# Patient Record
Sex: Female | Born: 1980 | Race: White | Hispanic: No | Marital: Married | Smoking: Never smoker
Health system: Southern US, Community
[De-identification: ages and names within clinical notes are randomized; demographics above are authoritative.]

## PROBLEM LIST (undated history)

## (undated) DIAGNOSIS — T7840XA Allergy, unspecified, initial encounter: Secondary | ICD-10-CM

## (undated) DIAGNOSIS — Z8619 Personal history of other infectious and parasitic diseases: Secondary | ICD-10-CM

## (undated) DIAGNOSIS — M1990 Unspecified osteoarthritis, unspecified site: Secondary | ICD-10-CM

## (undated) DIAGNOSIS — E785 Hyperlipidemia, unspecified: Secondary | ICD-10-CM

## (undated) DIAGNOSIS — F419 Anxiety disorder, unspecified: Secondary | ICD-10-CM

## (undated) DIAGNOSIS — R768 Other specified abnormal immunological findings in serum: Secondary | ICD-10-CM

## (undated) HISTORY — DX: Hyperlipidemia, unspecified: E78.5

## (undated) HISTORY — DX: Allergy, unspecified, initial encounter: T78.40XA

## (undated) HISTORY — DX: Anxiety disorder, unspecified: F41.9

## (undated) HISTORY — DX: Personal history of other infectious and parasitic diseases: Z86.19

## (undated) HISTORY — DX: Other specified abnormal immunological findings in serum: R76.8

## (undated) HISTORY — PX: HIP SURGERY: SHX245

## (undated) HISTORY — DX: Unspecified osteoarthritis, unspecified site: M19.90

---

## 2014-10-18 ENCOUNTER — Ambulatory Visit: Payer: Self-pay | Admitting: Sports Medicine

## 2014-11-08 ENCOUNTER — Ambulatory Visit: Payer: Self-pay | Admitting: Sports Medicine

## 2014-11-10 ENCOUNTER — Ambulatory Visit: Payer: Self-pay | Admitting: Sports Medicine

## 2014-11-14 ENCOUNTER — Ambulatory Visit: Payer: Self-pay | Admitting: Sports Medicine

## 2014-12-06 ENCOUNTER — Encounter: Payer: Self-pay | Admitting: Sports Medicine

## 2014-12-06 ENCOUNTER — Ambulatory Visit (INDEPENDENT_AMBULATORY_CARE_PROVIDER_SITE_OTHER): Payer: Managed Care, Other (non HMO) | Admitting: Sports Medicine

## 2014-12-06 VITALS — BP 110/74 | Ht 66.0 in | Wt 125.0 lb

## 2014-12-06 DIAGNOSIS — G5751 Tarsal tunnel syndrome, right lower limb: Secondary | ICD-10-CM

## 2014-12-06 DIAGNOSIS — M79674 Pain in right toe(s): Secondary | ICD-10-CM | POA: Diagnosis not present

## 2014-12-06 NOTE — Progress Notes (Signed)
Patient ID: Ashley Adkins, female   DOB: 1980/08/10, 34 y.o.   MRN: [id number] Ms. Ashley Adkins is a 34 year old female with a known L4-L5 herniated disc presents to clinic complaining of right foot pain and inability to flex her great toe. Patient states this issue began in July. She is a Ambulance person and notes she feels more pain on the medial aspect of her right ankle and into her great toe with running and swimming along with her great toe no longer flexing at all times. She does wear neutral orthotics which she notes helps some with the pain when she runs. She also notes when she is running for long periods of time, she gets some numbness to the medial aspect of her right foot along with an involuntary intoeing of this foot. She is still able to do all her normal activities but with some noted pain. She runs and a pair of off-the-shelf orthotics. She has tried custom orthotics in the past without good results.  PMHx: L4-L5 herniated disc, LLE venous insufficiency  Review of Systems: (+) right foot pain, inability to flex right great toe, numbness (-) tingling, urinary or fecal incontinence, weakness  Physical Examination BP 110/74 mmHg  Ht 5\' 6"  (1.676 m)  Wt 125 lb (56.7 kg)  BMI 20.19 kg/m2 Constitutional: Healthy, well-nourished, well-appearing Gait: mild intoeing of right foot with running Right hip: Full ROM with 3/5 hip flexion and 3/5 hip abduction, 5/5 hip adduction Left Hip: Full ROM with 5/5 hip flexion, 5/5 hip abduction/adduction Right Ankle: Full ROM with 5/5 strength with DF, PF, inversion and flexion, Anterior drawer negative. Positive tinel at the tarsal tunnel, TTP over medial aspect of right ankle, 5/5 strength of right great toe extension but 0/5 strength of right great toe flexion. Able to flex at the MTP joint but not at the IP joint. DTR bilaterally 1+ Sensation with light touch intact. No atrophy  Ultrasound showed an intact flexor hallucis longus tendon.  Assessment  and Plan: 34 year old female with: 1. Right foot pain, weakness, and loss of great toe flexion, concern for tarsal tunnel syndrome vs lumbar radiculopathy  Patient does have a history of lumbar radiculopathy. The weakness with great toe flexion is concerning. I want to proceed with an EMG/nerve conduction study to better evaluate. Phone follow-up after that study to discuss the results and delineate further treatment.

## 2014-12-15 ENCOUNTER — Ambulatory Visit (INDEPENDENT_AMBULATORY_CARE_PROVIDER_SITE_OTHER): Payer: Managed Care, Other (non HMO) | Admitting: Diagnostic Neuroimaging

## 2014-12-15 ENCOUNTER — Encounter (INDEPENDENT_AMBULATORY_CARE_PROVIDER_SITE_OTHER): Payer: Self-pay | Admitting: Diagnostic Neuroimaging

## 2014-12-15 DIAGNOSIS — M2141 Flat foot [pes planus] (acquired), right foot: Secondary | ICD-10-CM

## 2014-12-15 DIAGNOSIS — R29898 Other symptoms and signs involving the musculoskeletal system: Secondary | ICD-10-CM

## 2014-12-15 DIAGNOSIS — Z0289 Encounter for other administrative examinations: Secondary | ICD-10-CM

## 2014-12-16 NOTE — Procedures (Signed)
   [facility]  NCS (NERVE CONDUCTION STUDY) WITH EMG (ELECTROMYOGRAPHY) REPORT   STUDY DATE: 12/15/14 PATIENT NAME: Ashley Adkins DOB: 28-Apr-1980 MRN: [id number]  ORDERING CLINICIAN: Judi Saa, DO  TECHNOLOGIST: Gearldine Shown  ELECTROMYOGRAPHER: Glenford Bayley. Horst Ostermiller, MD  CLINICAL INFORMATION: 34 year old female with right toe flexion weakness and right foot numbness. Evaluate for lumbar radiculopathy versus tarsal tunnel syndrome.  FINDINGS: NERVE CONDUCTION STUDY: Right peroneal and tibial motor responses and F wave latencies are normal. Right sural and peroneal sensory responses are normal. Bilateral medial and lateral plantar responses are normal.   NEEDLE ELECTROMYOGRAPHY: Needle examination of right lower extremity vastus medialis, tibialis anterior, tibialis posterior, gastrocnemius and right L4-5 and L5-S1 paraspinal muscles are normal.  IMPRESSION:  Normal study. No electrodiagnostic evidence of large fiber neuropathy, lumbar radiculopathy or tarsal tunnel syndrome at this time.    INTERPRETING PHYSICIAN:  Suanne Marker, MD Certified in Neurology, Neurophysiology and Neuroimaging  [facility] [address] [place], [place] [address] [phone]

## 2014-12-22 ENCOUNTER — Telehealth: Payer: Self-pay | Admitting: Diagnostic Neuroimaging

## 2014-12-23 ENCOUNTER — Telehealth: Payer: Self-pay | Admitting: Sports Medicine

## 2014-12-23 DIAGNOSIS — G5751 Tarsal tunnel syndrome, right lower limb: Secondary | ICD-10-CM

## 2014-12-23 DIAGNOSIS — M5126 Other intervertebral disc displacement, lumbar region: Secondary | ICD-10-CM

## 2014-12-23 DIAGNOSIS — M79674 Pain in right toe(s): Secondary | ICD-10-CM

## 2014-12-23 NOTE — Addendum Note (Signed)
Addended by: Annita Brod on: 12/23/2014 10:24 AM   Modules accepted: Orders

## 2014-12-23 NOTE — Telephone Encounter (Signed)
I spoke with the patient on the phone today after reviewing the results of her EMG/nerve conduction study. It is a normal study. I am still at a loss to explain her inability to flex her great toe. I still think the reason is neurological despite the normal EMG. Therefore, I will have her see Dr.Toni Lucia Gaskins for further evaluation. I've asked the patient to follow-up with me after that consultation. If neurology is unable to provide an answer that I will likely have her see Dr.John Hewitt.

## 2014-12-29 ENCOUNTER — Ambulatory Visit (INDEPENDENT_AMBULATORY_CARE_PROVIDER_SITE_OTHER): Payer: Managed Care, Other (non HMO) | Admitting: Neurology

## 2014-12-29 ENCOUNTER — Encounter: Payer: Self-pay | Admitting: Neurology

## 2014-12-29 VITALS — BP 130/69 | HR 58 | Ht 66.0 in | Wt 126.8 lb

## 2014-12-29 DIAGNOSIS — R269 Unspecified abnormalities of gait and mobility: Secondary | ICD-10-CM

## 2014-12-29 DIAGNOSIS — G629 Polyneuropathy, unspecified: Secondary | ICD-10-CM | POA: Diagnosis not present

## 2014-12-29 DIAGNOSIS — M2141 Flat foot [pes planus] (acquired), right foot: Secondary | ICD-10-CM

## 2014-12-29 DIAGNOSIS — G35 Multiple sclerosis: Secondary | ICD-10-CM

## 2014-12-29 DIAGNOSIS — M5416 Radiculopathy, lumbar region: Secondary | ICD-10-CM

## 2014-12-29 DIAGNOSIS — M79671 Pain in right foot: Secondary | ICD-10-CM

## 2014-12-29 DIAGNOSIS — G5791 Unspecified mononeuropathy of right lower limb: Secondary | ICD-10-CM | POA: Diagnosis not present

## 2014-12-29 DIAGNOSIS — R29898 Other symptoms and signs involving the musculoskeletal system: Secondary | ICD-10-CM | POA: Insufficient documentation

## 2014-12-29 MED ORDER — CYCLOBENZAPRINE HCL ER 15 MG PO CP24: 15.0000 mg | ORAL_CAPSULE | Freq: Every day | ORAL | Status: DC | PRN

## 2014-12-29 NOTE — Progress Notes (Signed)
[facility]    Provider:  Dr Lucia Gaskins Referring Provider: Ralene Cork, DO Primary Care Physician:  No PCP Per Patient  CC:  Right toe weakness  HPI:  Candee Hoon is a 34 y.o. female here as a referral from Dr. Margaretha Sheffield for right toe weakness. She has problems moving her toe. She can't get her flip flops on and she has pain in the achilles. Her right foot has been falling asleep when running. She wears narrow shoes. Mostly the right toe goes numb. She has back pain and L4/L5. She was running the other day and her toe was burning. She has LBP that comes and goes. She has neck pain and musculoskeletal pain. Worse with lifting her little boy and when he lays on him. She saw a podiatrist and they sent her to a neurologist and she has a bone sour on the top of her big toe on the right. Her right foot turns in. No history of neurologic symptoms. She is fatigued.   Reviewed notes, labs and imaging from outside physicians, which showed:  EMG/NCS results: personally reviewed data and agree with following: NEEDLE ELECTROMYOGRAPHY: Needle examination of right lower extremity vastus medialis, tibialis anterior, tibialis posterior, gastrocnemius and right L4-5 and L5-S1 paraspinal muscles are normal.  Review of Systems: Patient complains of symptoms per HPI as well as the following symptoms: fatigue, feeling cold. Pertinent negatives per HPI. All others negative.   Social History   Social History  . Marital Status: Married    Spouse Name: Mat Carne  . Number of Children: 1  . Years of Education: 16   Occupational History  . stay at home mom    Social History Main Topics  . Smoking status: Never Smoker   . Smokeless tobacco: Not on file  . Alcohol Use: No  . Drug Use: No  . Sexual Activity: Not on file   Other Topics Concern  . Not on file   Social History Narrative   Lives at home with husband and child.   Caffeine use: 1-2 cups per day   Tea- 1 cup/day         History reviewed. No pertinent family history.  History reviewed. No pertinent past medical history.  Past Surgical History  Procedure Laterality Date  . Hip surgery Left     Current Outpatient Prescriptions  Medication Sig Dispense Refill  . Fexofenadine HCl (ALLEGRA PO) Take 1 tablet by mouth daily.     No current facility-administered medications for this visit.    Allergies as of 12/29/2014 - Review Complete 12/29/2014  Allergen Reaction Noted  . Latex Rash 12/06/2014    Vitals: BP 130/69 mmHg  Pulse 58  Ht  (1.676 m)  Wt 126 lb 12.8 oz (57.516 kg)  BMI 20.48 kg/m2 Last Weight:  Wt Readings from Last 1 Encounters:  12/29/14 126 lb 12.8 oz (57.516 kg)   Last Height:   Ht Readings from Last 1 Encounters:  12/29/14  (1.676 m)   Physical exam: Exam: Gen: NAD, conversant, well nourised, well groomed                     CV: RRR, no MRG. No Carotid Bruits. No peripheral edema, warm, nontender Eyes: Conjunctivae clear without exudates or hemorrhage  Neuro: Detailed Neurologic Exam  Speech:    Speech is normal; fluent and spontaneous with normal comprehension.  Cognition:    The patient is oriented to person, place, and time;  recent and remote memory intact;     language fluent;     normal attention, concentration,     fund of knowledge Cranial Nerves:    The pupils are equal, round, and reactive to light. The fundi are normal and spontaneous venous pulsations are present. Visual fields are full to finger confrontation. Extraocular movements are intact. Trigeminal sensation is intact and the muscles of mastication are normal. The face is symmetric. The palate elevates in the midline. Hearing intact. Voice is normal. Shoulder shrug is normal. The tongue has normal motion without fasciculations.   Coordination:    Normal finger to nose and heel to shin. Normal rapid alternating movements.   Gait:    Heel-toe and tandem gait are normal.   Motor  Observation:    No asymmetry, no atrophy, and no involuntary movements noted. Tone:    Normal muscle tone.    Posture:    Posture is normal. normal erect    Strength: weakness of right great toe extension and toe  flexion. Otherwise strength is V/V in the upper and lower limbs.      Sensation: dec sensation in the right foot     Reflex Exam:  DTR's:    Deep tendon reflexes in the upper and lower extremities are normal bilaterally.   Toes:    The toes are downgoing bilaterally.   Clonus:    Clonus is absent.       Assessment/Plan:  [age] year old with weakness and paresthesias of the right great toe as well as weakness of toe flexion. EMG/NCS was unrevealing and did not reveal a peripheral cause. Can order MRI of the lumbar spine, cervical and brain.   Naomie Dean, MD  [facility] [address] [place], [place] [address]  Phone [phone] Fax [phone]  A total of 40 minutes was spent face-to-face with this patient. Over half this time was spent on counseling patient on the weakness diagnosis and different diagnostic and therapeutic options available.

## 2014-12-29 NOTE — Patient Instructions (Signed)
Overall you are doing fairly well but I do want to suggest a few things today:   Remember to drink plenty of fluid, eat healthy meals and do not skip any meals. Try to eat protein with a every meal and eat a healthy snack such as fruit or nuts in between meals. Try to keep a regular sleep-wake schedule and try to exercise daily, particularly in the form of walking, 20-30 minutes a day, if you can.   As far as your medications are concerned, I would like to suggest: Amrix  at night  As far as diagnostic testing: MRi of the brain, cervical spine and lumbar spine, labs  I would like to see you back after imaging, sooner if we need to. Please call us with any interim questions, concerns, problems, updates or refill requests.   Please also call us for any test results so we can go over those with you on the phone.  My clinical assistant and will answer any of your questions and relay your messages to me and also relay most of my messages to you.   Our phone number is [phone]. We also have an after hours call service for urgent matters and there is a physician on-call for urgent questions. For any emergencies you know to call 911 or go to the nearest emergency room

## 2015-01-02 LAB — THYROID PANEL WITH TSH
Free Thyroxine Index: 2.2 (ref 1.2–4.9)
T3 Uptake Ratio: 31 % (ref 24–39)
T4 TOTAL: 7.1 ug/dL (ref 4.5–12.0)
TSH: 1.03 u[IU]/mL (ref 0.450–4.500)

## 2015-01-02 LAB — ENA+DNA/DS+SJORGEN'S
ENA RNP Ab: 0.8 AI (ref 0.0–0.9)
ENA SM Ab Ser-aCnc: <0.2 AI (ref 0.0–0.9)
ENA SSA (RO) Ab: <0.2 AI (ref 0.0–0.9)
ENA SSB (LA) Ab: <0.2 AI (ref 0.0–0.9)
dsDNA Ab: 12 IU/mL — ABNORMAL HIGH (ref 0–9)

## 2015-01-02 LAB — COMPREHENSIVE METABOLIC PANEL
A/G RATIO: 2 (ref 1.1–2.5)
ALK PHOS: 50 IU/L (ref 39–117)
ALT: 9 IU/L (ref 0–32)
AST: 21 IU/L (ref 0–40)
Albumin: 4.4 g/dL (ref 3.5–5.5)
BILIRUBIN TOTAL: 0.3 mg/dL (ref 0.0–1.2)
BUN/Creatinine Ratio: 16 (ref 8–20)
BUN: 15 mg/dL (ref 6–20)
CHLORIDE: 101 mmol/L (ref 96–106)
CO2: 23 mmol/L (ref 18–29)
Calcium: 9.3 mg/dL (ref 8.7–10.2)
Creatinine, Ser: 0.92 mg/dL (ref 0.57–1.00)
GFR calc Af Amer: 93 mL/min/{1.73_m2} (ref >59)
GFR, EST NON AFRICAN AMERICAN: 80 mL/min/{1.73_m2} (ref >59)
GLOBULIN, TOTAL: 2.2 g/dL (ref 1.5–4.5)
Glucose: 104 mg/dL — ABNORMAL HIGH (ref 65–99)
POTASSIUM: 4.4 mmol/L (ref 3.5–5.2)
SODIUM: 139 mmol/L (ref 134–144)
Total Protein: 6.6 g/dL (ref 6.0–8.5)

## 2015-01-02 LAB — VITAMIN B6: Vitamin B6: >100 ug/L — ABNORMAL HIGH (ref 2.0–32.8)

## 2015-01-02 LAB — RHEUMATOID FACTOR: Rhuematoid fact SerPl-aCnc: <10 IU/mL (ref 0.0–13.9)

## 2015-01-02 LAB — VITAMIN B1: Thiamine: 109.3 nmol/L (ref 66.5–200.0)

## 2015-01-02 LAB — HEAVY METALS, BLOOD
Arsenic: 6 ug/L (ref 2–23)
Lead, Blood: NOT DETECTED ug/dL (ref 0–19)
MERCURY: 2.5 ug/L (ref 0.0–14.9)

## 2015-01-02 LAB — B12 AND FOLATE PANEL: VITAMIN B 12: 970 pg/mL — AB (ref 211–946)

## 2015-01-02 LAB — B. BURGDORFI ANTIBODIES: Lyme IgG/IgM Ab: <0.91 {ISR} (ref 0.00–0.90)

## 2015-01-02 LAB — CBC
Hematocrit: 36.9 % (ref 34.0–46.6)
Hemoglobin: 12.2 g/dL (ref 11.1–15.9)
MCH: 27.8 pg (ref 26.6–33.0)
MCHC: 33.1 g/dL (ref 31.5–35.7)
MCV: 84 fL (ref 79–97)
NRBC: 0 % (ref 0–0)
Platelets: 202 10*3/uL (ref 150–379)
RBC: 4.39 x10E6/uL (ref 3.77–5.28)
RDW: 13.5 % (ref 12.3–15.4)
WBC: 4 10*3/uL (ref 3.4–10.8)

## 2015-01-02 LAB — METHYLMALONIC ACID, SERUM: METHYLMALONIC ACID: 138 nmol/L (ref 0–378)

## 2015-01-02 LAB — HEMOGLOBIN A1C
Est. average glucose Bld gHb Est-mCnc: 114 mg/dL
Hgb A1c MFr Bld: 5.6 % (ref 4.8–5.6)

## 2015-01-02 LAB — ANA W/REFLEX: Anti Nuclear Antibody(ANA): POSITIVE — AB

## 2015-01-04 ENCOUNTER — Other Ambulatory Visit: Payer: Self-pay | Admitting: Neurology

## 2015-01-04 DIAGNOSIS — R768 Other specified abnormal immunological findings in serum: Secondary | ICD-10-CM

## 2015-01-05 ENCOUNTER — Telehealth: Payer: Self-pay | Admitting: *Deleted

## 2015-01-05 NOTE — Telephone Encounter (Signed)
Called pt and relayed results per Dr Lucia Gaskins note. Pt verbalized understanding. Gave her hours for when she can come for lab work. She denied taking extra Vitamin B6. She only takes multivitamin.

## 2015-01-05 NOTE — Telephone Encounter (Signed)
-----   Message from Anson Fret, MD sent at 01/04/2015  2:59 PM EST ----- Let patient know her labs looked good. One lab was positive though, double stranded DNA which we see in rheumatologic disorders such as Lupus but I suspect this may be a false positive (we get false positive results here often) as hers was just barely above the max normal value (max normal 9 and hers was 12). I ordered a follow up test and she can come to the office to have it done at her convenience. Her vitamin B6 level was a little high which is fine but wanted to know if she is she taking B6 supplementation daily? I didn't see that on her med list. If she is, how much is she taking? Recommended intake is no more than a few mg a day. i ask just to make sure she is not taking too much daily, too high doses can cause toxicity (like taking 100mg  a day can cause toxicity) thanks.

## 2015-01-10 NOTE — Telephone Encounter (Addendum)
Pt called and would like a call back pertaining to her lab work. She would also like to know if an MRI is necessary right now. It will cost $3,000. She also asked about nerve test on her toe. Please call and advise [phone]

## 2015-01-11 ENCOUNTER — Other Ambulatory Visit: Payer: Self-pay | Admitting: Neurology

## 2015-01-11 DIAGNOSIS — E785 Hyperlipidemia, unspecified: Secondary | ICD-10-CM

## 2015-01-11 NOTE — Telephone Encounter (Signed)
She will come in for emg. She will hold off on the MRI for now and we will follow clinically.

## 2015-01-11 NOTE — Telephone Encounter (Signed)
LVM for pt to call back and schedule EMG. Gave [facility] phone number. Per Dr Lucia Gaskins- only doing EMG, NOT NCS. Can place in next available slot.

## 2015-01-12 NOTE — Telephone Encounter (Signed)
Scheduled appt for 02/09/15 at 930am per Dr Lucia Gaskins request for EMG.

## 2015-01-13 ENCOUNTER — Other Ambulatory Visit (INDEPENDENT_AMBULATORY_CARE_PROVIDER_SITE_OTHER): Payer: Self-pay

## 2015-01-13 DIAGNOSIS — E785 Hyperlipidemia, unspecified: Secondary | ICD-10-CM

## 2015-01-13 DIAGNOSIS — Z0289 Encounter for other administrative examinations: Secondary | ICD-10-CM

## 2015-01-13 DIAGNOSIS — R768 Other specified abnormal immunological findings in serum: Secondary | ICD-10-CM

## 2015-01-16 LAB — LIPID PANEL
CHOL/HDL RATIO: 1.8 ratio (ref 0.0–4.4)
CHOLESTEROL TOTAL: 221 mg/dL — AB (ref 100–199)
HDL: 120 mg/dL (ref >39)
LDL CALC: 92 mg/dL (ref 0–99)
TRIGLYCERIDES: 46 mg/dL (ref 0–149)
VLDL Cholesterol Cal: 9 mg/dL (ref 5–40)

## 2015-01-16 LAB — ANTINUCLEAR ANTIBODIES, IFA: ANA Titer 1: NEGATIVE

## 2015-01-18 ENCOUNTER — Telehealth: Payer: Self-pay | Admitting: *Deleted

## 2015-01-18 NOTE — Telephone Encounter (Signed)
-----   Message from Anson Fret, MD sent at 01/17/2015  7:01 PM EST ----- ANA was negative. The other positive ANA was a false positive. Her cholesterol looks beautiful, her LDL is 92 and her HDL is 120. Her overall 221 is not a problem. thanks

## 2015-01-18 NOTE — Telephone Encounter (Signed)
Called and spoke to pt about results per Dr Ahern note. Pt verbalized understanding.  

## 2015-02-09 ENCOUNTER — Ambulatory Visit (INDEPENDENT_AMBULATORY_CARE_PROVIDER_SITE_OTHER): Payer: Managed Care, Other (non HMO) | Admitting: Neurology

## 2015-02-09 DIAGNOSIS — M2141 Flat foot [pes planus] (acquired), right foot: Secondary | ICD-10-CM | POA: Diagnosis not present

## 2015-02-09 DIAGNOSIS — R29898 Other symptoms and signs involving the musculoskeletal system: Secondary | ICD-10-CM

## 2015-02-11 NOTE — Progress Notes (Signed)
  [facility]    Provider:  Dr Lucia Gaskins  CC: Right toe weakness  HPI: Ashley Adkins is a 35 y.o. female here as a referral from Dr. Margaretha Sheffield for right toe flexion weakness in all the toes. It is improving. She has problems moving her toes. She can't get her flip flops on and she has pain in the achilles. Her right foot has been falling asleep when running. She wears narrow shoes. Mostly the right toe goes numb. She has back pain and L4/L5. She was running the other day and her toe was burning. She has LBP that comes and goes. She has neck pain and musculoskeletal pain. Worse with lifting her little boy and when he lays on him. She saw a podiatrist and they sent her to a neurologist and she has a bone sour on the top of her big toe on the right. Her right foot turns in. No history of neurologic symptoms. She is fatigued.    EMG/NCS 12/16/2014 FINDINGS: NERVE CONDUCTION STUDY: Right peroneal and tibial motor responses and F wave latencies are normal. Right sural and peroneal sensory responses are normal. Bilateral medial and lateral plantar responses are normal. NEEDLE ELECTROMYOGRAPHY: Needle examination of right lower extremity vastus medialis, tibialis anterior, tibialis posterior, gastrocnemius and right L4-5 and L5-S1 paraspinal muscles are normal. IMPRESSION:  Normal study. No electrodiagnostic evidence of large fiber neuropathy, lumbar radiculopathy or tarsal tunnel syndrome at this time.   Patient is back to day for additional EMG testing. The Flexor Digitorum Longus, Flexor Hallucis Longus, Flexor Digitorum Brevis, Biceps Femoris(L) and Gluteus Maximum/Medius all WNL.   Assessment/Plan:    Naomie Dean, MD  [facility] [address] [place], [place] [address]  Phone [phone] Fax [phone]

## 2015-03-08 NOTE — Telephone Encounter (Signed)
error 

## 2015-03-23 ENCOUNTER — Ambulatory Visit: Payer: Managed Care, Other (non HMO) | Admitting: Allergy and Immunology

## 2015-08-07 ENCOUNTER — Other Ambulatory Visit: Payer: Self-pay | Admitting: Neurology

## 2015-08-07 ENCOUNTER — Telehealth: Payer: Self-pay | Admitting: Neurology

## 2015-08-07 MED ORDER — CYCLOBENZAPRINE HCL 10 MG PO TABS: 10.0000 mg | ORAL_TABLET | Freq: Three times a day (TID) | ORAL | 3 refills | Status: DC | PRN

## 2015-08-07 NOTE — Telephone Encounter (Signed)
Pt called in requesting rx cyclobenzaprine (AMRIX) 15 MG 24 hr capsule be re-done for an 8hr dose. The current rx makes her to drowsy through out the day. Please call and advise [phone]

## 2015-08-07 NOTE — Telephone Encounter (Signed)
I called in the tid prn version thanks!

## 2015-08-08 NOTE — Telephone Encounter (Signed)
Called and LVM relaying Dr Lucia Gaskins called in new rx request for q8hr. Gave [facility] phone number if she has further questions.

## 2016-01-08 DIAGNOSIS — K635 Polyp of colon: Secondary | ICD-10-CM

## 2016-01-08 HISTORY — DX: Polyp of colon: K63.5

## 2016-03-13 DIAGNOSIS — D235 Other benign neoplasm of skin of trunk: Secondary | ICD-10-CM | POA: Diagnosis not present

## 2016-03-13 DIAGNOSIS — D1801 Hemangioma of skin and subcutaneous tissue: Secondary | ICD-10-CM | POA: Diagnosis not present

## 2016-03-13 DIAGNOSIS — L821 Other seborrheic keratosis: Secondary | ICD-10-CM | POA: Diagnosis not present

## 2016-03-13 DIAGNOSIS — L309 Dermatitis, unspecified: Secondary | ICD-10-CM | POA: Diagnosis not present

## 2016-03-13 DIAGNOSIS — B079 Viral wart, unspecified: Secondary | ICD-10-CM | POA: Diagnosis not present

## 2016-04-29 DIAGNOSIS — M9905 Segmental and somatic dysfunction of pelvic region: Secondary | ICD-10-CM | POA: Diagnosis not present

## 2016-04-29 DIAGNOSIS — M62830 Muscle spasm of back: Secondary | ICD-10-CM | POA: Diagnosis not present

## 2016-04-29 DIAGNOSIS — M791 Myalgia: Secondary | ICD-10-CM | POA: Diagnosis not present

## 2016-04-29 DIAGNOSIS — M9903 Segmental and somatic dysfunction of lumbar region: Secondary | ICD-10-CM | POA: Diagnosis not present

## 2016-04-29 DIAGNOSIS — M9902 Segmental and somatic dysfunction of thoracic region: Secondary | ICD-10-CM | POA: Diagnosis not present

## 2016-05-03 DIAGNOSIS — M791 Myalgia: Secondary | ICD-10-CM | POA: Diagnosis not present

## 2016-05-03 DIAGNOSIS — Z1151 Encounter for screening for human papillomavirus (HPV): Secondary | ICD-10-CM | POA: Diagnosis not present

## 2016-05-03 DIAGNOSIS — M62830 Muscle spasm of back: Secondary | ICD-10-CM | POA: Diagnosis not present

## 2016-05-03 DIAGNOSIS — M9903 Segmental and somatic dysfunction of lumbar region: Secondary | ICD-10-CM | POA: Diagnosis not present

## 2016-05-03 DIAGNOSIS — Z1389 Encounter for screening for other disorder: Secondary | ICD-10-CM | POA: Diagnosis not present

## 2016-05-03 DIAGNOSIS — M9905 Segmental and somatic dysfunction of pelvic region: Secondary | ICD-10-CM | POA: Diagnosis not present

## 2016-05-03 DIAGNOSIS — Z6820 Body mass index (BMI) 20.0-20.9, adult: Secondary | ICD-10-CM | POA: Diagnosis not present

## 2016-05-03 DIAGNOSIS — F419 Anxiety disorder, unspecified: Secondary | ICD-10-CM | POA: Diagnosis not present

## 2016-05-03 DIAGNOSIS — Z130 Encounter for screening for diseases of the blood and blood-forming organs and certain disorders involving the immune mechanism: Secondary | ICD-10-CM | POA: Diagnosis not present

## 2016-05-03 DIAGNOSIS — R194 Change in bowel habit: Secondary | ICD-10-CM | POA: Diagnosis not present

## 2016-05-03 DIAGNOSIS — Z01419 Encounter for gynecological examination (general) (routine) without abnormal findings: Secondary | ICD-10-CM | POA: Diagnosis not present

## 2016-05-03 DIAGNOSIS — Z124 Encounter for screening for malignant neoplasm of cervix: Secondary | ICD-10-CM | POA: Diagnosis not present

## 2016-05-03 DIAGNOSIS — M9902 Segmental and somatic dysfunction of thoracic region: Secondary | ICD-10-CM | POA: Diagnosis not present

## 2016-05-24 DIAGNOSIS — M9905 Segmental and somatic dysfunction of pelvic region: Secondary | ICD-10-CM | POA: Diagnosis not present

## 2016-05-24 DIAGNOSIS — M9902 Segmental and somatic dysfunction of thoracic region: Secondary | ICD-10-CM | POA: Diagnosis not present

## 2016-05-24 DIAGNOSIS — M791 Myalgia: Secondary | ICD-10-CM | POA: Diagnosis not present

## 2016-05-24 DIAGNOSIS — M62830 Muscle spasm of back: Secondary | ICD-10-CM | POA: Diagnosis not present

## 2016-05-24 DIAGNOSIS — M9903 Segmental and somatic dysfunction of lumbar region: Secondary | ICD-10-CM | POA: Diagnosis not present

## 2016-05-28 DIAGNOSIS — M62830 Muscle spasm of back: Secondary | ICD-10-CM | POA: Diagnosis not present

## 2016-05-28 DIAGNOSIS — M791 Myalgia: Secondary | ICD-10-CM | POA: Diagnosis not present

## 2016-05-28 DIAGNOSIS — M9905 Segmental and somatic dysfunction of pelvic region: Secondary | ICD-10-CM | POA: Diagnosis not present

## 2016-05-28 DIAGNOSIS — M9903 Segmental and somatic dysfunction of lumbar region: Secondary | ICD-10-CM | POA: Diagnosis not present

## 2016-05-28 DIAGNOSIS — M9902 Segmental and somatic dysfunction of thoracic region: Secondary | ICD-10-CM | POA: Diagnosis not present

## 2016-05-29 DIAGNOSIS — R197 Diarrhea, unspecified: Secondary | ICD-10-CM | POA: Diagnosis not present

## 2016-05-29 DIAGNOSIS — R1031 Right lower quadrant pain: Secondary | ICD-10-CM | POA: Diagnosis not present

## 2016-05-31 DIAGNOSIS — M791 Myalgia: Secondary | ICD-10-CM | POA: Diagnosis not present

## 2016-05-31 DIAGNOSIS — M9902 Segmental and somatic dysfunction of thoracic region: Secondary | ICD-10-CM | POA: Diagnosis not present

## 2016-05-31 DIAGNOSIS — M9903 Segmental and somatic dysfunction of lumbar region: Secondary | ICD-10-CM | POA: Diagnosis not present

## 2016-05-31 DIAGNOSIS — M62830 Muscle spasm of back: Secondary | ICD-10-CM | POA: Diagnosis not present

## 2016-05-31 DIAGNOSIS — M9905 Segmental and somatic dysfunction of pelvic region: Secondary | ICD-10-CM | POA: Diagnosis not present

## 2016-06-05 DIAGNOSIS — M791 Myalgia: Secondary | ICD-10-CM | POA: Diagnosis not present

## 2016-06-05 DIAGNOSIS — M62830 Muscle spasm of back: Secondary | ICD-10-CM | POA: Diagnosis not present

## 2016-06-05 DIAGNOSIS — M9905 Segmental and somatic dysfunction of pelvic region: Secondary | ICD-10-CM | POA: Diagnosis not present

## 2016-06-05 DIAGNOSIS — M9903 Segmental and somatic dysfunction of lumbar region: Secondary | ICD-10-CM | POA: Diagnosis not present

## 2016-06-05 DIAGNOSIS — M9902 Segmental and somatic dysfunction of thoracic region: Secondary | ICD-10-CM | POA: Diagnosis not present

## 2016-06-10 DIAGNOSIS — M9902 Segmental and somatic dysfunction of thoracic region: Secondary | ICD-10-CM | POA: Diagnosis not present

## 2016-06-10 DIAGNOSIS — M9905 Segmental and somatic dysfunction of pelvic region: Secondary | ICD-10-CM | POA: Diagnosis not present

## 2016-06-10 DIAGNOSIS — M791 Myalgia: Secondary | ICD-10-CM | POA: Diagnosis not present

## 2016-06-10 DIAGNOSIS — M9903 Segmental and somatic dysfunction of lumbar region: Secondary | ICD-10-CM | POA: Diagnosis not present

## 2016-06-10 DIAGNOSIS — M62830 Muscle spasm of back: Secondary | ICD-10-CM | POA: Diagnosis not present

## 2016-06-11 ENCOUNTER — Other Ambulatory Visit: Payer: Self-pay | Admitting: Family Medicine

## 2016-06-11 DIAGNOSIS — R1030 Lower abdominal pain, unspecified: Secondary | ICD-10-CM | POA: Diagnosis not present

## 2016-06-11 DIAGNOSIS — R590 Localized enlarged lymph nodes: Secondary | ICD-10-CM | POA: Diagnosis not present

## 2016-06-11 DIAGNOSIS — R1032 Left lower quadrant pain: Secondary | ICD-10-CM

## 2016-06-12 ENCOUNTER — Ambulatory Visit
Admission: RE | Admit: 2016-06-12 | Discharge: 2016-06-12 | Disposition: A | Discharge status: Home / Self Care | Payer: BLUE CROSS/BLUE SHIELD | Source: Ambulatory Visit | Attending: Family Medicine | Admitting: Family Medicine

## 2016-06-12 DIAGNOSIS — R1909 Other intra-abdominal and pelvic swelling, mass and lump: Secondary | ICD-10-CM | POA: Diagnosis not present

## 2016-06-12 DIAGNOSIS — R1032 Left lower quadrant pain: Secondary | ICD-10-CM

## 2016-06-13 ENCOUNTER — Ambulatory Visit
Admission: RE | Admit: 2016-06-13 | Discharge: 2016-06-13 | Disposition: A | Discharge status: Home / Self Care | Payer: BLUE CROSS/BLUE SHIELD | Source: Ambulatory Visit | Attending: Family Medicine | Admitting: Family Medicine

## 2016-06-13 ENCOUNTER — Other Ambulatory Visit: Payer: Self-pay | Admitting: Family Medicine

## 2016-06-13 DIAGNOSIS — R1032 Left lower quadrant pain: Secondary | ICD-10-CM

## 2016-06-13 DIAGNOSIS — R1909 Other intra-abdominal and pelvic swelling, mass and lump: Secondary | ICD-10-CM | POA: Diagnosis not present

## 2016-06-13 MED ORDER — IOPAMIDOL (ISOVUE-300) INJECTION 61%
100.0000 mL | Freq: Once | INTRAVENOUS | Status: AC | PRN
  Administered 2016-06-13: 100 mL via INTRAVENOUS

## 2016-06-17 ENCOUNTER — Other Ambulatory Visit: Payer: Managed Care, Other (non HMO)

## 2016-06-19 DIAGNOSIS — D72820 Lymphocytosis (symptomatic): Secondary | ICD-10-CM | POA: Diagnosis not present

## 2016-06-19 DIAGNOSIS — R197 Diarrhea, unspecified: Secondary | ICD-10-CM | POA: Diagnosis not present

## 2016-06-19 DIAGNOSIS — K635 Polyp of colon: Secondary | ICD-10-CM | POA: Diagnosis not present

## 2016-06-20 ENCOUNTER — Encounter: Payer: Self-pay | Admitting: Obstetrics and Gynecology

## 2016-06-25 DIAGNOSIS — K635 Polyp of colon: Secondary | ICD-10-CM | POA: Diagnosis not present

## 2016-06-25 DIAGNOSIS — D72820 Lymphocytosis (symptomatic): Secondary | ICD-10-CM | POA: Diagnosis not present

## 2016-09-19 DIAGNOSIS — M9905 Segmental and somatic dysfunction of pelvic region: Secondary | ICD-10-CM | POA: Diagnosis not present

## 2016-09-19 DIAGNOSIS — M62830 Muscle spasm of back: Secondary | ICD-10-CM | POA: Diagnosis not present

## 2016-09-19 DIAGNOSIS — M9903 Segmental and somatic dysfunction of lumbar region: Secondary | ICD-10-CM | POA: Diagnosis not present

## 2016-09-19 DIAGNOSIS — M9902 Segmental and somatic dysfunction of thoracic region: Secondary | ICD-10-CM | POA: Diagnosis not present

## 2016-09-19 DIAGNOSIS — M791 Myalgia: Secondary | ICD-10-CM | POA: Diagnosis not present

## 2016-09-23 DIAGNOSIS — M791 Myalgia: Secondary | ICD-10-CM | POA: Diagnosis not present

## 2016-09-23 DIAGNOSIS — M9905 Segmental and somatic dysfunction of pelvic region: Secondary | ICD-10-CM | POA: Diagnosis not present

## 2016-09-23 DIAGNOSIS — M9902 Segmental and somatic dysfunction of thoracic region: Secondary | ICD-10-CM | POA: Diagnosis not present

## 2016-09-23 DIAGNOSIS — M62830 Muscle spasm of back: Secondary | ICD-10-CM | POA: Diagnosis not present

## 2016-09-23 DIAGNOSIS — M9903 Segmental and somatic dysfunction of lumbar region: Secondary | ICD-10-CM | POA: Diagnosis not present

## 2016-10-24 DIAGNOSIS — M9903 Segmental and somatic dysfunction of lumbar region: Secondary | ICD-10-CM | POA: Diagnosis not present

## 2016-10-24 DIAGNOSIS — M62830 Muscle spasm of back: Secondary | ICD-10-CM | POA: Diagnosis not present

## 2016-10-24 DIAGNOSIS — M791 Myalgia, unspecified site: Secondary | ICD-10-CM | POA: Diagnosis not present

## 2016-10-24 DIAGNOSIS — M9902 Segmental and somatic dysfunction of thoracic region: Secondary | ICD-10-CM | POA: Diagnosis not present

## 2016-10-24 DIAGNOSIS — M9905 Segmental and somatic dysfunction of pelvic region: Secondary | ICD-10-CM | POA: Diagnosis not present

## 2016-10-30 DIAGNOSIS — L920 Granuloma annulare: Secondary | ICD-10-CM | POA: Diagnosis not present

## 2016-11-05 DIAGNOSIS — M62830 Muscle spasm of back: Secondary | ICD-10-CM | POA: Diagnosis not present

## 2016-11-05 DIAGNOSIS — M9903 Segmental and somatic dysfunction of lumbar region: Secondary | ICD-10-CM | POA: Diagnosis not present

## 2016-11-05 DIAGNOSIS — M9902 Segmental and somatic dysfunction of thoracic region: Secondary | ICD-10-CM | POA: Diagnosis not present

## 2016-11-05 DIAGNOSIS — M9905 Segmental and somatic dysfunction of pelvic region: Secondary | ICD-10-CM | POA: Diagnosis not present

## 2016-11-05 DIAGNOSIS — M791 Myalgia, unspecified site: Secondary | ICD-10-CM | POA: Diagnosis not present

## 2016-11-12 DIAGNOSIS — M9903 Segmental and somatic dysfunction of lumbar region: Secondary | ICD-10-CM | POA: Diagnosis not present

## 2016-11-12 DIAGNOSIS — M791 Myalgia, unspecified site: Secondary | ICD-10-CM | POA: Diagnosis not present

## 2016-11-12 DIAGNOSIS — M62830 Muscle spasm of back: Secondary | ICD-10-CM | POA: Diagnosis not present

## 2016-11-12 DIAGNOSIS — M9905 Segmental and somatic dysfunction of pelvic region: Secondary | ICD-10-CM | POA: Diagnosis not present

## 2016-11-12 DIAGNOSIS — M9902 Segmental and somatic dysfunction of thoracic region: Secondary | ICD-10-CM | POA: Diagnosis not present

## 2016-12-19 DIAGNOSIS — M9903 Segmental and somatic dysfunction of lumbar region: Secondary | ICD-10-CM | POA: Diagnosis not present

## 2016-12-19 DIAGNOSIS — M791 Myalgia, unspecified site: Secondary | ICD-10-CM | POA: Diagnosis not present

## 2016-12-19 DIAGNOSIS — M9902 Segmental and somatic dysfunction of thoracic region: Secondary | ICD-10-CM | POA: Diagnosis not present

## 2016-12-19 DIAGNOSIS — M9905 Segmental and somatic dysfunction of pelvic region: Secondary | ICD-10-CM | POA: Diagnosis not present

## 2016-12-19 DIAGNOSIS — M62830 Muscle spasm of back: Secondary | ICD-10-CM | POA: Diagnosis not present

## 2016-12-24 DIAGNOSIS — M9905 Segmental and somatic dysfunction of pelvic region: Secondary | ICD-10-CM | POA: Diagnosis not present

## 2016-12-24 DIAGNOSIS — M62830 Muscle spasm of back: Secondary | ICD-10-CM | POA: Diagnosis not present

## 2016-12-24 DIAGNOSIS — M9903 Segmental and somatic dysfunction of lumbar region: Secondary | ICD-10-CM | POA: Diagnosis not present

## 2016-12-24 DIAGNOSIS — M791 Myalgia, unspecified site: Secondary | ICD-10-CM | POA: Diagnosis not present

## 2016-12-24 DIAGNOSIS — M9902 Segmental and somatic dysfunction of thoracic region: Secondary | ICD-10-CM | POA: Diagnosis not present

## 2017-01-08 DIAGNOSIS — M9903 Segmental and somatic dysfunction of lumbar region: Secondary | ICD-10-CM | POA: Diagnosis not present

## 2017-01-08 DIAGNOSIS — M791 Myalgia, unspecified site: Secondary | ICD-10-CM | POA: Diagnosis not present

## 2017-01-08 DIAGNOSIS — M9902 Segmental and somatic dysfunction of thoracic region: Secondary | ICD-10-CM | POA: Diagnosis not present

## 2017-01-08 DIAGNOSIS — M62830 Muscle spasm of back: Secondary | ICD-10-CM | POA: Diagnosis not present

## 2017-01-08 DIAGNOSIS — M9905 Segmental and somatic dysfunction of pelvic region: Secondary | ICD-10-CM | POA: Diagnosis not present

## 2017-02-07 DIAGNOSIS — M9902 Segmental and somatic dysfunction of thoracic region: Secondary | ICD-10-CM | POA: Diagnosis not present

## 2017-02-07 DIAGNOSIS — M62830 Muscle spasm of back: Secondary | ICD-10-CM | POA: Diagnosis not present

## 2017-02-07 DIAGNOSIS — M9903 Segmental and somatic dysfunction of lumbar region: Secondary | ICD-10-CM | POA: Diagnosis not present

## 2017-02-07 DIAGNOSIS — M9905 Segmental and somatic dysfunction of pelvic region: Secondary | ICD-10-CM | POA: Diagnosis not present

## 2017-02-07 DIAGNOSIS — M791 Myalgia, unspecified site: Secondary | ICD-10-CM | POA: Diagnosis not present

## 2017-02-12 ENCOUNTER — Ambulatory Visit: Payer: BLUE CROSS/BLUE SHIELD | Admitting: Sports Medicine

## 2017-02-17 DIAGNOSIS — M9905 Segmental and somatic dysfunction of pelvic region: Secondary | ICD-10-CM | POA: Diagnosis not present

## 2017-02-17 DIAGNOSIS — M791 Myalgia, unspecified site: Secondary | ICD-10-CM | POA: Diagnosis not present

## 2017-02-17 DIAGNOSIS — M9903 Segmental and somatic dysfunction of lumbar region: Secondary | ICD-10-CM | POA: Diagnosis not present

## 2017-02-17 DIAGNOSIS — M9902 Segmental and somatic dysfunction of thoracic region: Secondary | ICD-10-CM | POA: Diagnosis not present

## 2017-02-27 DIAGNOSIS — M9902 Segmental and somatic dysfunction of thoracic region: Secondary | ICD-10-CM | POA: Diagnosis not present

## 2017-02-27 DIAGNOSIS — M9905 Segmental and somatic dysfunction of pelvic region: Secondary | ICD-10-CM | POA: Diagnosis not present

## 2017-02-27 DIAGNOSIS — M791 Myalgia, unspecified site: Secondary | ICD-10-CM | POA: Diagnosis not present

## 2017-02-27 DIAGNOSIS — M9903 Segmental and somatic dysfunction of lumbar region: Secondary | ICD-10-CM | POA: Diagnosis not present

## 2017-03-20 DIAGNOSIS — M7071 Other bursitis of hip, right hip: Secondary | ICD-10-CM | POA: Diagnosis not present

## 2017-03-20 DIAGNOSIS — M9902 Segmental and somatic dysfunction of thoracic region: Secondary | ICD-10-CM | POA: Diagnosis not present

## 2017-03-20 DIAGNOSIS — M9903 Segmental and somatic dysfunction of lumbar region: Secondary | ICD-10-CM | POA: Diagnosis not present

## 2017-03-20 DIAGNOSIS — M9905 Segmental and somatic dysfunction of pelvic region: Secondary | ICD-10-CM | POA: Diagnosis not present

## 2017-06-17 DIAGNOSIS — M9903 Segmental and somatic dysfunction of lumbar region: Secondary | ICD-10-CM | POA: Diagnosis not present

## 2017-06-17 DIAGNOSIS — M9905 Segmental and somatic dysfunction of pelvic region: Secondary | ICD-10-CM | POA: Diagnosis not present

## 2017-06-17 DIAGNOSIS — M9902 Segmental and somatic dysfunction of thoracic region: Secondary | ICD-10-CM | POA: Diagnosis not present

## 2017-06-17 DIAGNOSIS — M7071 Other bursitis of hip, right hip: Secondary | ICD-10-CM | POA: Diagnosis not present

## 2017-06-25 DIAGNOSIS — Z01419 Encounter for gynecological examination (general) (routine) without abnormal findings: Secondary | ICD-10-CM | POA: Diagnosis not present

## 2017-06-25 DIAGNOSIS — Z130 Encounter for screening for diseases of the blood and blood-forming organs and certain disorders involving the immune mechanism: Secondary | ICD-10-CM | POA: Diagnosis not present

## 2017-06-25 DIAGNOSIS — Z1389 Encounter for screening for other disorder: Secondary | ICD-10-CM | POA: Diagnosis not present

## 2017-06-25 DIAGNOSIS — Z124 Encounter for screening for malignant neoplasm of cervix: Secondary | ICD-10-CM | POA: Diagnosis not present

## 2017-06-25 DIAGNOSIS — Z681 Body mass index (BMI) 19 or less, adult: Secondary | ICD-10-CM | POA: Diagnosis not present

## 2017-08-29 DIAGNOSIS — M9902 Segmental and somatic dysfunction of thoracic region: Secondary | ICD-10-CM | POA: Diagnosis not present

## 2017-08-29 DIAGNOSIS — M9903 Segmental and somatic dysfunction of lumbar region: Secondary | ICD-10-CM | POA: Diagnosis not present

## 2017-08-29 DIAGNOSIS — M7071 Other bursitis of hip, right hip: Secondary | ICD-10-CM | POA: Diagnosis not present

## 2017-08-29 DIAGNOSIS — M9905 Segmental and somatic dysfunction of pelvic region: Secondary | ICD-10-CM | POA: Diagnosis not present

## 2017-09-09 DIAGNOSIS — M9902 Segmental and somatic dysfunction of thoracic region: Secondary | ICD-10-CM | POA: Diagnosis not present

## 2017-09-09 DIAGNOSIS — M7071 Other bursitis of hip, right hip: Secondary | ICD-10-CM | POA: Diagnosis not present

## 2017-09-09 DIAGNOSIS — M9905 Segmental and somatic dysfunction of pelvic region: Secondary | ICD-10-CM | POA: Diagnosis not present

## 2017-09-09 DIAGNOSIS — M9903 Segmental and somatic dysfunction of lumbar region: Secondary | ICD-10-CM | POA: Diagnosis not present

## 2017-09-10 DIAGNOSIS — M9902 Segmental and somatic dysfunction of thoracic region: Secondary | ICD-10-CM | POA: Diagnosis not present

## 2017-09-10 DIAGNOSIS — M9903 Segmental and somatic dysfunction of lumbar region: Secondary | ICD-10-CM | POA: Diagnosis not present

## 2017-09-10 DIAGNOSIS — M9905 Segmental and somatic dysfunction of pelvic region: Secondary | ICD-10-CM | POA: Diagnosis not present

## 2017-09-10 DIAGNOSIS — M7071 Other bursitis of hip, right hip: Secondary | ICD-10-CM | POA: Diagnosis not present

## 2017-09-11 DIAGNOSIS — M9903 Segmental and somatic dysfunction of lumbar region: Secondary | ICD-10-CM | POA: Diagnosis not present

## 2017-09-11 DIAGNOSIS — M7071 Other bursitis of hip, right hip: Secondary | ICD-10-CM | POA: Diagnosis not present

## 2017-09-11 DIAGNOSIS — M9902 Segmental and somatic dysfunction of thoracic region: Secondary | ICD-10-CM | POA: Diagnosis not present

## 2017-09-11 DIAGNOSIS — M9905 Segmental and somatic dysfunction of pelvic region: Secondary | ICD-10-CM | POA: Diagnosis not present

## 2017-09-12 DIAGNOSIS — M7071 Other bursitis of hip, right hip: Secondary | ICD-10-CM | POA: Diagnosis not present

## 2017-09-12 DIAGNOSIS — M9903 Segmental and somatic dysfunction of lumbar region: Secondary | ICD-10-CM | POA: Diagnosis not present

## 2017-09-12 DIAGNOSIS — M9905 Segmental and somatic dysfunction of pelvic region: Secondary | ICD-10-CM | POA: Diagnosis not present

## 2017-09-12 DIAGNOSIS — M9902 Segmental and somatic dysfunction of thoracic region: Secondary | ICD-10-CM | POA: Diagnosis not present

## 2017-09-16 DIAGNOSIS — M9905 Segmental and somatic dysfunction of pelvic region: Secondary | ICD-10-CM | POA: Diagnosis not present

## 2017-09-16 DIAGNOSIS — M7071 Other bursitis of hip, right hip: Secondary | ICD-10-CM | POA: Diagnosis not present

## 2017-09-16 DIAGNOSIS — M9902 Segmental and somatic dysfunction of thoracic region: Secondary | ICD-10-CM | POA: Diagnosis not present

## 2017-09-16 DIAGNOSIS — M9903 Segmental and somatic dysfunction of lumbar region: Secondary | ICD-10-CM | POA: Diagnosis not present

## 2017-09-18 DIAGNOSIS — M545 Low back pain: Secondary | ICD-10-CM | POA: Diagnosis not present

## 2017-09-23 DIAGNOSIS — M7071 Other bursitis of hip, right hip: Secondary | ICD-10-CM | POA: Diagnosis not present

## 2017-09-23 DIAGNOSIS — M9905 Segmental and somatic dysfunction of pelvic region: Secondary | ICD-10-CM | POA: Diagnosis not present

## 2017-09-23 DIAGNOSIS — M9902 Segmental and somatic dysfunction of thoracic region: Secondary | ICD-10-CM | POA: Diagnosis not present

## 2017-09-23 DIAGNOSIS — M9903 Segmental and somatic dysfunction of lumbar region: Secondary | ICD-10-CM | POA: Diagnosis not present

## 2017-09-24 DIAGNOSIS — M545 Low back pain: Secondary | ICD-10-CM | POA: Diagnosis not present

## 2017-09-29 DIAGNOSIS — Z124 Encounter for screening for malignant neoplasm of cervix: Secondary | ICD-10-CM | POA: Diagnosis not present

## 2017-09-29 DIAGNOSIS — Z01419 Encounter for gynecological examination (general) (routine) without abnormal findings: Secondary | ICD-10-CM | POA: Diagnosis not present

## 2017-09-30 DIAGNOSIS — Z1151 Encounter for screening for human papillomavirus (HPV): Secondary | ICD-10-CM | POA: Diagnosis not present

## 2017-09-30 DIAGNOSIS — Z124 Encounter for screening for malignant neoplasm of cervix: Secondary | ICD-10-CM | POA: Diagnosis not present

## 2017-09-30 DIAGNOSIS — M545 Low back pain: Secondary | ICD-10-CM | POA: Diagnosis not present

## 2017-10-13 DIAGNOSIS — Z139 Encounter for screening, unspecified: Secondary | ICD-10-CM | POA: Diagnosis not present

## 2017-10-22 DIAGNOSIS — F411 Generalized anxiety disorder: Secondary | ICD-10-CM | POA: Diagnosis not present

## 2017-10-29 DIAGNOSIS — F411 Generalized anxiety disorder: Secondary | ICD-10-CM | POA: Diagnosis not present

## 2017-11-04 DIAGNOSIS — L309 Dermatitis, unspecified: Secondary | ICD-10-CM | POA: Diagnosis not present

## 2017-11-17 DIAGNOSIS — L920 Granuloma annulare: Secondary | ICD-10-CM | POA: Diagnosis not present

## 2017-11-20 DIAGNOSIS — M7071 Other bursitis of hip, right hip: Secondary | ICD-10-CM | POA: Diagnosis not present

## 2017-11-20 DIAGNOSIS — M9905 Segmental and somatic dysfunction of pelvic region: Secondary | ICD-10-CM | POA: Diagnosis not present

## 2017-11-20 DIAGNOSIS — M9903 Segmental and somatic dysfunction of lumbar region: Secondary | ICD-10-CM | POA: Diagnosis not present

## 2017-11-20 DIAGNOSIS — M9902 Segmental and somatic dysfunction of thoracic region: Secondary | ICD-10-CM | POA: Diagnosis not present

## 2017-11-25 DIAGNOSIS — L309 Dermatitis, unspecified: Secondary | ICD-10-CM | POA: Diagnosis not present

## 2017-11-25 DIAGNOSIS — R21 Rash and other nonspecific skin eruption: Secondary | ICD-10-CM | POA: Diagnosis not present

## 2017-11-25 DIAGNOSIS — E785 Hyperlipidemia, unspecified: Secondary | ICD-10-CM | POA: Diagnosis not present

## 2017-11-27 DIAGNOSIS — M7071 Other bursitis of hip, right hip: Secondary | ICD-10-CM | POA: Diagnosis not present

## 2017-11-27 DIAGNOSIS — M9905 Segmental and somatic dysfunction of pelvic region: Secondary | ICD-10-CM | POA: Diagnosis not present

## 2017-11-27 DIAGNOSIS — M9902 Segmental and somatic dysfunction of thoracic region: Secondary | ICD-10-CM | POA: Diagnosis not present

## 2017-11-27 DIAGNOSIS — M9903 Segmental and somatic dysfunction of lumbar region: Secondary | ICD-10-CM | POA: Diagnosis not present

## 2017-12-08 ENCOUNTER — Ambulatory Visit: Payer: Self-pay

## 2017-12-08 ENCOUNTER — Ambulatory Visit (INDEPENDENT_AMBULATORY_CARE_PROVIDER_SITE_OTHER): Payer: BLUE CROSS/BLUE SHIELD | Admitting: Sports Medicine

## 2017-12-08 ENCOUNTER — Encounter: Payer: Self-pay | Admitting: Sports Medicine

## 2017-12-08 VITALS — BP 110/68 | HR 61 | Ht 66.0 in | Wt 124.6 lb

## 2017-12-08 DIAGNOSIS — M79671 Pain in right foot: Secondary | ICD-10-CM | POA: Diagnosis not present

## 2017-12-08 DIAGNOSIS — M24551 Contracture, right hip: Secondary | ICD-10-CM | POA: Diagnosis not present

## 2017-12-08 DIAGNOSIS — R29898 Other symptoms and signs involving the musculoskeletal system: Secondary | ICD-10-CM

## 2017-12-08 NOTE — Patient Instructions (Addendum)
Also check out [employer]" which is a program developed by Dr. Myles Lipps.   There are links to a couple of his YouTube Videos below and I would like to see performing one of his videos 5-6 days per week.    A good intro video is: "Independence from Pain 7-minute Video" - [email or web address]   Exercises that focus more on the neck are as below: Dr. Derrill Kay with [employer] teaching neck and shoulder details Part 1 - https://[email or web address] Part 2 Dr. Derrill Kay with [facility] quick routine to practice daily - [email or web address]  Do not try to attempt the entire video when first beginning.    Try breaking of each exercise that he goes into shorter segments.  Otherwise if they perform an exercise for 45 seconds, start with 15 seconds and rest and then resume when they begin the new activity.  If you work your way up to being able to do these videos without having to stop, I expect you will see significant improvements in your pain.  If you enjoy his videos and would like to find out more you can look on his website: [email or web address].  He has a workout streaming option as well as a DVD set available for purchase.  Amazon has the best price for his DVDs.     Please perform the exercise program that we have prepared for you and gone over in detail on a daily basis.  In addition to the handout you were provided you can access your program through: [email or web address]   Your unique program code is:  [email or web address]     The cost of the pair of custom orthotics is $195.  You can look into having your insurance company cover the cost of these. Some insurance companies cover the cost and other do not.  If they do not you will be responsible for the full cost of the orthotics.  I am happy to do these for you at any time, you just need to let our front office schedulers know you would like an "orthotic appointment."  Please also make sure you  bring athletic shoes with you on the day of your orthotic appointment or whatever shoes you plan to wear your orthotics in most frequently.   When you call your insurance company you will need to provide them the CPT code which is L3030 and there are 2 units.  You can call them  and ask if this is covered.

## 2017-12-08 NOTE — Progress Notes (Signed)
Ashley Adkins. Ashley Adkins [facility] at [facility] [phone]  Ashley Adkins - 37 y.o. female MRN [id number]  Date of birth: [birth year]  Visit Date:12/08/2017    PCP: Ashley Goodpasture, PA-C   Referred by: Ashley Goodpasture, PA-C   SUBJECTIVE:  Ashley Adkins is here for Initial Assessment (Threw out back in August. Lower back, groin pain, L leg pain while running. Tore FHL tendon R foot, saw [facility]. Also having trouble when biking. Has seen Dr. Sherryle Adkins, thinks it is related to foot inj. L hip injury in her 82s Done in [place]. PT with Dr. Georgeanne Adkins, told it was nerve issue. Tried dry needling, accunture, ice, IBU, turmeric, adjustment, KT taking. Feels like her gait is off while running. Has also been seen by Dr. Margaretha Adkins and neurology with [facility], had nerve conduction study. )    HPI: Patient presents with the above symptoms.  Ultimately she is having right foot symptoms as well as lumbar spine symptoms.  She has had issues with her right foot when she had a tear of her flexor hallucis longus that was incomplete.  She is been having continued ongoing symptoms per outlined and Ashley Adkins notes.  She also is reporting significant  REVIEW OF SYSTEMS: Denies night time disturbances. Denies fevers, chills, or night sweats. Denies unexplained weight loss. Denies personal history of cancer. Denies changes in bowel or bladder habits. Denies recent unreported falls. Denies new or worsening dyspnea or wheezing. Denies headaches or dizziness.  Denies numbness, tingling or weakness  In the extremities.  Denies dizziness or presyncopal episodes Denies lower extremity edema    HISTORY:  Prior history reviewed and updated per electronic medical record.   Social History   Occupational History  . Occupation: stay at home mom  Tobacco Use  . Smoking status: Never Smoker  . Smokeless tobacco: Never Used  Substance and Sexual Activity  .  Alcohol use: No    Alcohol/week: 0.0 standard drinks  . Drug use: No  . Sexual activity: Not on file   Social History   Social History Narrative   Lives at home with husband and child.   Caffeine use: 1-2 cups per day   Tea- 1 cup/day     History reviewed. No pertinent past medical history. Past Surgical History:  Procedure Laterality Date  . HIP SURGERY Left    family history is not on file. No results for input(s): HGBA1C, LABURIC, CREATININE, CALCIUM, MG, AST, ALT, CKTOTAL, TSH in the last 8760 hours.   OBJECTIVE:  VS:  HT:5\' 6"  (167.6 cm)   WT:124 lb 9.6 oz (56.5 kg)  BMI:20.12    BP:110/68  HR:61bpm  TEMP: ( )  RESP:100 %   PHYSICAL EXAM: CONSTITUTIONAL: Well-developed, Well-nourished and In no acute distress PSYCHIATRIC: Alert & appropriately interactive. and Not depressed or anxious appearing. RESPIRATORY: No increased work of breathing and Trachea Midline EYES: Pupils are equal., EOM intact without nystagmus. and No scleral icterus.  VASCULAR EXAM: Warm and well perfused NEURO: unremarkable  MSK Exam: Good cervical and lumbar range of motion although there are functional limitations. Negative Spurling's compression test and Lhermitte's compression test.   Upper extremity lower extremity strength is 5/5 in all myotomes. Normal sensation Significant anterior chain dominant posture.  She has weakness with hip abduction on the right with TFL predominant recruitment pattern.  Her FHL is weak with isolated great toe flexion but she is able heel toe walk without significant difficulty.  ASSESSMENT  1. Weakness of right foot   2. Right foot pain   3. Right hip flexor tightness   4. Weakness of right hip     PLAN:  Pertinent additional documentation may be included in corresponding procedure notes, imaging studies, problem based documentation and patient instructions.  Procedures:  PROCEDURE NOTE: THERAPEUTIC EXERCISES [phone])  Discussed the foundation  of treatment for this condition is physical therapy and/or daily (5-6 days/week) therapeutic exercises, focusing on core strengthening, coordination, neuromuscular control/reeducation. 15 minutes spent for Therapeutic exercises as below and as referenced in the AVS. This included exercises focusing on stretching, strengthening, with significant focus on eccentric aspects.  Proper technique shown and discussed handout in great detail with Ashley Adkins. All questions were discussed and answered.  Long term goals include an improvement in range of motion, strength, endurance as well as avoiding reinjury. Frequency of visits is one time as determined during today's office visit. Frequency of exercises to be performed is as per handout. EXERCISES REVIEWED: Derrill Kay Exercises Hip ABduction strengthening with focus on Glute Medius Recruitment      DISCUSSION: . Ultimately she has multifactorial issues including type/weak right hip with 4 out of 5 hip abduction is right hip flexor tightness especially the slightly externally rotated position.   . The underlying flexor hallucis longus tear that she has is contributing to the increased breakdown right side which is causing a compensatory pattern throughout her entire gait cycle.  . 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   Marland Kitchen "Foundations Training" with Dr. Myles Adkins exercises will be show for working on her rotational strength and right hip tightness.  Links to [employer] provided today per Patient Instructions.  These exercises were developed by Ashley Adkins, DC with a strong emphasis on core neuromuscular reducation and postural realignment through body-weight exercises. . Discussed red flag symptoms that warrant earlier emergent evaluation and  patient voices understanding. . Activity modifications and the importance of avoiding exacerbating activities (limiting pain to no more than a 4 / 10 during or following activity) recommended and discussed.  Follow-up:  . Return for orthotics as schedule allows.  . If any lack of improvement: consider further diagnostic evaluation with X-rays and/or MRI of her lumbar spine. . At follow up will plan : FASTec full cushioned insoles         Andrena Mews, DO    [facility] [facility] Physician

## 2017-12-12 ENCOUNTER — Ambulatory Visit (INDEPENDENT_AMBULATORY_CARE_PROVIDER_SITE_OTHER): Payer: BLUE CROSS/BLUE SHIELD | Admitting: Sports Medicine

## 2017-12-12 DIAGNOSIS — R29898 Other symptoms and signs involving the musculoskeletal system: Secondary | ICD-10-CM

## 2017-12-12 DIAGNOSIS — R269 Unspecified abnormalities of gait and mobility: Secondary | ICD-10-CM

## 2017-12-12 DIAGNOSIS — G5791 Unspecified mononeuropathy of right lower limb: Secondary | ICD-10-CM

## 2017-12-12 NOTE — Progress Notes (Signed)
  Ashley Adkins. [name] Sports Medicine [facility] at [facility] [phone]  Ashley Adkins - 37 y.o. female MRN [id number]  Date of birth: 22-Aug-1980  Visit Date: 12/12/2017   PCP: Carilyn Goodpasture, PA-C   Referred by: Carilyn Goodpasture, PA-C  SUBJECTIVE:  Ashley Adkins is here for a procedure only visit for custom orthotic fabrication.  OBJECTIVE:  PHYSICAL EXAM: Please see previous exam notes for full evaluation of foot and gait exam. MSK Exam: Flatfoot.  She does have a flexor lag of the right great toe.  Slight circumduction of her gait with overpronation.  ASSESSMENT   1. Weakness of right foot   2. Gait disturbance   3. Neuritis of right foot      PLAN & PROCEDURES:   . Custom orthotics fabricated today as below     PROCEDURE: CUSTOM ORTHOTIC FABRICATION Patient's underlying musculoskeletal conditions are directly related to poor biomechanics and will benefit from a functional custom orthotic.  There are no significant foot deformities that complicate the use of a custom orthotic.  The patient was fitted for a standard, cushioned, semi-rigid orthotic. The orthotic was heated & placed on the orthotic stand. The patient was positioned in subtalar neutral position and 10 of ankle dorsiflexion and weight bearing stance on the heated orthotic blank. After completion of the molding a base was applied to the orthotic blank. The orthotic was ground to a stable position for weightbearing. The patient ambulated in these and reported they were comfortable without pressure spots.              BLANK:  Size 9 - Standard Cushioned               BASE:  Blue EVA      POSTINGS:  Small additional metatarsal pad on the right          Andrena Mews, DO    [employer] [facility] Physician

## 2017-12-25 ENCOUNTER — Encounter: Payer: Self-pay | Admitting: Sports Medicine

## 2017-12-25 ENCOUNTER — Ambulatory Visit (INDEPENDENT_AMBULATORY_CARE_PROVIDER_SITE_OTHER): Payer: BLUE CROSS/BLUE SHIELD | Admitting: Sports Medicine

## 2017-12-25 VITALS — BP 100/70 | HR 55 | Ht 66.0 in | Wt 125.8 lb

## 2017-12-25 DIAGNOSIS — R269 Unspecified abnormalities of gait and mobility: Secondary | ICD-10-CM | POA: Diagnosis not present

## 2017-12-25 DIAGNOSIS — R29898 Other symptoms and signs involving the musculoskeletal system: Secondary | ICD-10-CM

## 2017-12-25 DIAGNOSIS — M24551 Contracture, right hip: Secondary | ICD-10-CM

## 2017-12-25 MED ORDER — DIAZEPAM 2 MG PO TABS: 2.0000 mg | ORAL_TABLET | Freq: Three times a day (TID) | ORAL | 0 refills | Status: DC | PRN

## 2017-12-25 NOTE — Patient Instructions (Signed)
[address] Level 5 office visit for bike fit  The cost of the pair of custom orthotics is $195.  You can look into having your insurance company cover the cost of these. Some insurance companies cover the cost and other do not.  If they do not you will be responsible for the full cost of the orthotics.  I am happy to do these for you at any time, you just need to let our front office schedulers know you would like an "orthotic appointment."  Please also make sure you bring athletic shoes with you on the day of your orthotic appointment or whatever shoes you plan to wear your orthotics in most frequently.   When you call your insurance company you will need to provide them the CPT code which is L3030 and there are 2 units.  You can call them  and ask if this is covered.

## 2018-01-12 ENCOUNTER — Encounter: Payer: Self-pay | Admitting: Sports Medicine

## 2018-01-12 ENCOUNTER — Ambulatory Visit (INDEPENDENT_AMBULATORY_CARE_PROVIDER_SITE_OTHER): Payer: BLUE CROSS/BLUE SHIELD | Admitting: Sports Medicine

## 2018-01-12 VITALS — BP 100/70 | HR 52 | Ht 66.0 in | Wt 126.6 lb

## 2018-01-12 DIAGNOSIS — M9904 Segmental and somatic dysfunction of sacral region: Secondary | ICD-10-CM

## 2018-01-12 DIAGNOSIS — M24551 Contracture, right hip: Secondary | ICD-10-CM | POA: Diagnosis not present

## 2018-01-12 DIAGNOSIS — M9902 Segmental and somatic dysfunction of thoracic region: Secondary | ICD-10-CM

## 2018-01-12 DIAGNOSIS — R29898 Other symptoms and signs involving the musculoskeletal system: Secondary | ICD-10-CM

## 2018-01-12 DIAGNOSIS — M9903 Segmental and somatic dysfunction of lumbar region: Secondary | ICD-10-CM | POA: Diagnosis not present

## 2018-01-12 DIAGNOSIS — M6289 Other specified disorders of muscle: Secondary | ICD-10-CM

## 2018-01-12 DIAGNOSIS — M9908 Segmental and somatic dysfunction of rib cage: Secondary | ICD-10-CM

## 2018-01-12 DIAGNOSIS — M9905 Segmental and somatic dysfunction of pelvic region: Secondary | ICD-10-CM

## 2018-01-12 MED ORDER — DIAZEPAM 2 MG PO TABS: 2.0000 mg | ORAL_TABLET | Freq: Three times a day (TID) | ORAL | 1 refills | Status: DC | PRN

## 2018-01-12 NOTE — Progress Notes (Signed)
Veverly FellsMichael D. Delorise Shinerigby, DO  [facility] at [facility] [phone]  Ashley Adkins - 38 y.o. female MRN [id number]  Date of birth: January 07, 1981  Visit Date: 01/12/2018  PCP: Carilyn GoodpastureWillard, Jennifer, Ashley Adkins   Referred by: Carilyn GoodpastureWillard, Jennifer, Ashley Adkins   SUBJECTIVE:  Chief Complaint  Patient presents with  . f/u R foot pain    Custom orthotics fabricated 12/12/17. Less nighttime disturbance with Diazepam.     HPI: Patient is here for follow-up of ongoing right foot, right hip, left knee pain.  She has responded well to the custom cushioned insoles and was actually able to run 6 miles though having some pain with this.  Pain does seem to be right-sided with running and occasionally does radiate into the anterior left knee when cycling.  This overall has improved and she does report some early morning fatigue with diazepam that she has been taking nightly but overall has felt less stressed during the day as well.  REVIEW OF SYSTEMS: She reports improved nighttime disturbances the point that she is no longer waking up but does have some fatigue first thing in the morning when she is taking up to 4 mg at a time she does feel like this is more effective.  Otherwise 12 point view of systems performed and negative.  HISTORY:  Prior history reviewed and updated per electronic medical record.  Social History   Occupational History  . Occupation: stay at home mom  Tobacco Use  . Smoking status: Never Smoker  . Smokeless tobacco: Never Used  Substance and Sexual Activity  . Alcohol use: No    Alcohol/week: 0.0 standard drinks  . Drug use: No  . Sexual activity: Not on file   Social History   Social History Narrative   Lives at home with husband and child.   Caffeine use: 1-2 cups per day   Tea- 1 cup/day       DATA OBTAINED & REVIEWED:  No results for input(s): HGBA1C, LABURIC, CREATINE, CALCIUM, AST, ALT, TSH in the last 8760 hours.  Invalid input(s): MAGNESIUM,  CK Problem  Muscle Tightness   Overall she is generally very flexible however she does have focally tight hip flexors especially and far externally rotated planes.  She does seem to carry a large amount of stress in her hips and low back.  She has responded well to low-dose Valium at night..    No specialty comments available.  OBJECTIVE:  VS:  HT:5\' 6"  (167.6 cm)   WT:126 lb 9.6 oz (57.4 kg)  BMI:20.44    BP:100/70  HR:(!) 52bpm  TEMP: ( )  RESP:100 %   PHYSICAL EXAM: Adult.  Thin, athletic build in no acute respiratory distress.  She is alert and appropriately interactive.  She does seem to be less anxious than she has been in the past.  She has good hip mobility knee mobility and ankle mobility but does have weakness with EHL testing on the right.   ASSESSMENT  1. Weakness of right hip   2. Right hip flexor tightness   3. Somatic dysfunction of thoracic region   4. Somatic dysfunction of lumbar region   5. Somatic dysfunction of rib cage region   6. Somatic dysfunction of pelvis region   7. Somatic dysfunction of sacral region   8. Muscle tightness     PLAN:  Pertinent additional documentation may be included in corresponding procedure notes, imaging studies, problem based documentation and patient instructions.  Procedures:  Osteopathic  manipulation was performed today based on physical exam findings.  Please see procedure note for further information including Osteopathic Exam findings  Medications:  Meds ordered this encounter  Medications  . diazepam (VALIUM) 2 MG tablet    Sig: Take 1-2 tablets (2-4 mg total) by mouth every 8 (eight) hours as needed for anxiety.    Dispense:  60 tablet    Refill:  1    Discussion/Instructions: Muscle tightness Overall she is generally very flexible however she does have focally tight hip flexors especially and far externally rotated planes.  She does seem to carry a large amount of stress in her hips and low back.  She has  responded well to low-dose Valium at night..  Overall generally she is doing better especially with the Valium.  Suspect that this is likely a physical manifestation of some of her stress that is treating some of her pain.  She has been performing her home therapeutic exercises and has modified her activities to the point she is feeling that she is doing better.  If any lack of improvement: consider further diagnostic evaluation with Further advanced diagnostic imaging but at this time this does continue to seem functional she has having steady improvements.   Return in about 4 weeks (around 02/09/2018).          Andrena Mews, DO    [place] [facility] Physician

## 2018-01-12 NOTE — Progress Notes (Signed)
PROCEDURE NOTE : OSTEOPATHIC MANIPULATION The decision today to treat with Osteopathic Manipulative Therapy (OMT) was based on physical exam findings. Verbal consent was obtained following a discussion with the patient regarding the of risks, benefits and potential side effects, including an acute pain flare,post manipulation soreness and need for repeat treatments.     Contraindications to OMT: NONE  Manipulation was performed as below: Regions Treated & Osteopathic Exam Findings  T6 - 10 Neutral, Rotated LEFT, Sidebent RIGHT Rib 8Right  Posterior L4 FRS right (Flexed, Rotated & Sidebent) Right psoas spasm Right anterior innonimate R on R sacral torsion   OMT Techniques Used  HVLA muscle energy myofascial release articulatory soft tissue    The patient tolerated the treatment well and reported Improved symptoms following treatment today. Patient was given medications, exercises, stretches and lifestyle modifications per AVS and verbally.

## 2018-01-12 NOTE — Assessment & Plan Note (Signed)
Overall she is generally very flexible however she does have focally tight hip flexors especially and far externally rotated planes.  She does seem to carry a large amount of stress in her hips and low back.  She has responded well to low-dose Valium at night.Ashley Adkins

## 2018-01-14 ENCOUNTER — Other Ambulatory Visit: Payer: Self-pay | Admitting: Sports Medicine

## 2018-01-14 ENCOUNTER — Ambulatory Visit: Payer: BLUE CROSS/BLUE SHIELD | Admitting: Sports Medicine

## 2018-01-16 ENCOUNTER — Ambulatory Visit (INDEPENDENT_AMBULATORY_CARE_PROVIDER_SITE_OTHER): Payer: BLUE CROSS/BLUE SHIELD | Admitting: Podiatry

## 2018-01-16 ENCOUNTER — Encounter: Payer: Self-pay | Admitting: Podiatry

## 2018-01-16 VITALS — BP 97/62 | HR 53 | Resp 16

## 2018-01-16 DIAGNOSIS — L600 Ingrowing nail: Secondary | ICD-10-CM

## 2018-01-16 MED ORDER — NEOMYCIN-POLYMYXIN-HC 3.5-10000-1 OT SOLN: OTIC | 1 refills | Status: DC

## 2018-01-16 NOTE — Patient Instructions (Signed)

## 2018-01-16 NOTE — Progress Notes (Signed)
   Subjective:    Patient ID: Ashley Adkins, female    DOB: [birth year], 39 y.o.   MRN: [id number]  HPI    Review of Systems  All other systems reviewed and are negative.      Objective:   Physical Exam        Assessment & Plan:

## 2018-01-23 ENCOUNTER — Telehealth: Payer: Self-pay | Admitting: Sports Medicine

## 2018-01-23 DIAGNOSIS — M25451 Effusion, right hip: Secondary | ICD-10-CM | POA: Diagnosis not present

## 2018-01-23 DIAGNOSIS — M7522 Bicipital tendinitis, left shoulder: Secondary | ICD-10-CM | POA: Diagnosis not present

## 2018-01-23 DIAGNOSIS — M5126 Other intervertebral disc displacement, lumbar region: Secondary | ICD-10-CM | POA: Diagnosis not present

## 2018-01-23 DIAGNOSIS — M9903 Segmental and somatic dysfunction of lumbar region: Secondary | ICD-10-CM | POA: Diagnosis not present

## 2018-01-23 NOTE — Telephone Encounter (Signed)
See note  Copied from CRM [id number]. Topic: General - Other >> Jan 23, 2018 11:42 AM Gean Birchwood R wrote: Patient was calling in wanting to know if she can get a script for PT Piliates so that her insurance will cover.  CB# [phone]

## 2018-01-26 NOTE — Telephone Encounter (Signed)
Returned pt's call and LM for her to return our call.  PT Pilates does not file w/ insurance plans so not sure if she still needs a script for PT unless she's planning to file claims on her own.  Asked pt to return call to Sports Med line directly at [phone].

## 2018-01-28 NOTE — Telephone Encounter (Signed)
Called pt again asking for her to return call r.e. script for PT Pilates

## 2018-01-30 ENCOUNTER — Encounter: Payer: Self-pay | Admitting: Physical Therapy

## 2018-01-30 NOTE — Telephone Encounter (Signed)
Called pt again today w/ no response.  Will send letter since pt is not responding to phone calls and have called pt 3x.

## 2018-02-02 DIAGNOSIS — M9903 Segmental and somatic dysfunction of lumbar region: Secondary | ICD-10-CM | POA: Diagnosis not present

## 2018-02-02 DIAGNOSIS — M25451 Effusion, right hip: Secondary | ICD-10-CM | POA: Diagnosis not present

## 2018-02-02 DIAGNOSIS — M5126 Other intervertebral disc displacement, lumbar region: Secondary | ICD-10-CM | POA: Diagnosis not present

## 2018-02-02 DIAGNOSIS — M7522 Bicipital tendinitis, left shoulder: Secondary | ICD-10-CM | POA: Diagnosis not present

## 2018-02-05 NOTE — Progress Notes (Signed)
Subjective:   Patient ID: Ashley Adkins, female   DOB: 38 y.o.   MRN: [id number]   HPI Patient presents stating she is had a lot of pain in her right big toe and states that she has ingrown toenail corners that are sore and make it hard for her to be comfortable.  States she is tried trimming and soaking without relief and they are gradually more of an issue for her and she would like to have it fixed.  Patient does not smoke and likes to be active   Review of Systems  All other systems reviewed and are negative.       Objective:  Physical Exam Vitals signs and nursing note reviewed.  Constitutional:      Appearance: She is well-developed.  Pulmonary:     Effort: Pulmonary effort is normal.  Musculoskeletal: Normal range of motion.  Skin:    General: Skin is warm.  Neurological:     Mental Status: She is alert.     Neurovascular status intact muscle strength is adequate range of motion within normal limits with patient found to have incurvated right hallux medial lateral borders that are painful when pressed and make shoe gear difficult.  There is no redness active drainage noted the patient was found to have good digital perfusion and is well oriented x3     Assessment:  Acute ingrown toenail deformity right hallux medial lateral border     Plan:  H&P condition reviewed and recommended correction.  I explained procedure risk and patient wants procedure and is willing to accept risk and today I infiltrated 60 mg like Marcaine mixture sterile prep applied to the toe sterile instrumentation I removed medial lateral borders exposed matrix and applied phenol 3 applications followed by alcohol lavage and sterile dressings.  Gave instructions for soaks and to leave dressings on 24 hours and to take them off earlier if throbbing were to occur and also recommended antibiotic drops for after procedure and after soaks and encouraged to call with any questions concerns signed visit

## 2018-02-06 DIAGNOSIS — M7522 Bicipital tendinitis, left shoulder: Secondary | ICD-10-CM | POA: Diagnosis not present

## 2018-02-06 DIAGNOSIS — M5126 Other intervertebral disc displacement, lumbar region: Secondary | ICD-10-CM | POA: Diagnosis not present

## 2018-02-06 DIAGNOSIS — M9903 Segmental and somatic dysfunction of lumbar region: Secondary | ICD-10-CM | POA: Diagnosis not present

## 2018-02-06 DIAGNOSIS — M25451 Effusion, right hip: Secondary | ICD-10-CM | POA: Diagnosis not present

## 2018-02-09 ENCOUNTER — Encounter: Payer: Self-pay | Admitting: Sports Medicine

## 2018-02-09 ENCOUNTER — Ambulatory Visit: Payer: BLUE CROSS/BLUE SHIELD | Admitting: Sports Medicine

## 2018-02-11 ENCOUNTER — Other Ambulatory Visit: Payer: Self-pay | Admitting: Chiropractic Medicine

## 2018-02-11 DIAGNOSIS — M25551 Pain in right hip: Secondary | ICD-10-CM

## 2018-02-13 NOTE — Progress Notes (Signed)
Ashley Adkins. Ashley Adkins Sports Medicine [facility] at [facility] [phone]  Ashley Adkins - 38 y.o. female MRN [id number]  Date of birth: 1980/03/10  Visit Date: 12/25/2017  PCP: Carilyn Goodpasture, PA-C   Referred by: Carilyn Goodpasture, PA-C  SUBJECTIVE:   Chief Complaint  Patient presents with  . Follow-up    R hip and R foot pain    HPI: Patient is here for follow-up of her right hip and foot pain.  She is highly anxious and perseverating on multiple joints and how they are interconnected.  She is interested in looking into a formal bike fit with possible IGLI orthotics.  She had some increased left knee pain while riding her bike.  She did try to run yesterday and had a small increase in right Q well pain.  This did cause her to circumduct her hip with running.  Her left shoulder continues to bother swimming.  REVIEW OF SYSTEMS: She does report some nighttime disturbances Denies fevers, chills, recent weight gain or weight loss.  No night sweats.  Pt denies any change in bowel or bladder habits, muscle weakness, numbness or falls associated with this pain.   HISTORY:  Prior history reviewed and updated per electronic medical record.   Social History   Occupational History  . Occupation: stay at home mom  Tobacco Use  . Smoking status: Never Smoker  . Smokeless tobacco: Never Used  Substance and Sexual Activity  . Alcohol use: No    Alcohol/week: 0.0 standard drinks  . Drug use: No  . Sexual activity: Not on file   Social History   Social History Narrative   Lives at home with husband and child.   Caffeine use: 1-2 cups per day   Tea- 1 cup/day     OBJECTIVE:  VS:  HT:5\' 6"  (167.6 cm)   WT:125 lb 12.8 oz (57.1 kg)  BMI:20.31    BP:100/70  HR:(!) 55bpm  TEMP: ( )  RESP:100 %   PHYSICAL EXAM: Adult female.  In no acute distress.  She is alert but highly anxious and Stream of thought process.  She does perseverate on the multiple  musculoskeletal complaints.  She seems overwhelmed.    ASSESSMENT:   1. Weakness of right foot   2. Gait disturbance   3. Right hip flexor tightness   4. Weakness of right hip     PROCEDURES:  None  PLAN:  Pertinent additional documentation may be included in corresponding procedure notes, imaging studies, problem based documentation and patient instructions.  No problem-specific Assessment & Plan notes found for this encounter.  >50% of this 25 minute visit spent in direct patient counseling and/or coordination of care.  Discussion was focused on education regarding the in discussing the pathoetiology and anticipated clinical course of the above condition.  Ultimately she has significantly high stress levels that are impacting her ability to return to activity.  I would like for her to try Valium at night to see if this is beneficial.  If any ongoing issues this is something I would encourage her to discuss further with her primary care physician peer  Information regarding billing for life it was provided today.  Continue previously prescribed home exercise program.   Activity modifications and the importance of avoiding exacerbating activities (limiting pain to no more than a 4 / 10 during or following activity) recommended and discussed.  Discussed red flag symptoms that warrant earlier emergent evaluation and patient voices understanding.  Meds ordered this encounter  Medications  . DISCONTD: diazepam (VALIUM) 2 MG tablet    Sig: Take 1 tablet (2 mg total) by mouth every 8 (eight) hours as needed for anxiety.    Dispense:  30 tablet    Refill:  0   Lab Orders  No laboratory test(s) ordered today   Imaging Orders  No imaging studies ordered today   Referral Orders  No referral(s) requested today    Return in about 2 weeks (around 01/08/2018) for consideration of repeat Osteopathic Manipulation.          Andrena Mews, DO    [facility] [facility] Physician

## 2018-02-15 ENCOUNTER — Ambulatory Visit
Admission: RE | Admit: 2018-02-15 | Discharge: 2018-02-15 | Disposition: A | Discharge status: Home / Self Care | Payer: BLUE CROSS/BLUE SHIELD | Source: Ambulatory Visit | Attending: Chiropractic Medicine | Admitting: Chiropractic Medicine

## 2018-02-15 DIAGNOSIS — M25551 Pain in right hip: Secondary | ICD-10-CM

## 2018-02-16 ENCOUNTER — Other Ambulatory Visit: Payer: Self-pay | Admitting: Chiropractic Medicine

## 2018-02-16 DIAGNOSIS — M25451 Effusion, right hip: Secondary | ICD-10-CM | POA: Diagnosis not present

## 2018-02-16 DIAGNOSIS — M7522 Bicipital tendinitis, left shoulder: Secondary | ICD-10-CM | POA: Diagnosis not present

## 2018-02-16 DIAGNOSIS — M25551 Pain in right hip: Secondary | ICD-10-CM

## 2018-02-16 DIAGNOSIS — M5126 Other intervertebral disc displacement, lumbar region: Secondary | ICD-10-CM | POA: Diagnosis not present

## 2018-02-16 DIAGNOSIS — M9903 Segmental and somatic dysfunction of lumbar region: Secondary | ICD-10-CM | POA: Diagnosis not present

## 2018-02-19 ENCOUNTER — Ambulatory Visit
Admission: RE | Admit: 2018-02-19 | Discharge: 2018-02-19 | Disposition: A | Discharge status: Home / Self Care | Payer: BLUE CROSS/BLUE SHIELD | Source: Ambulatory Visit | Attending: Chiropractic Medicine | Admitting: Chiropractic Medicine

## 2018-02-19 DIAGNOSIS — M25551 Pain in right hip: Secondary | ICD-10-CM

## 2018-02-19 DIAGNOSIS — S73191A Other sprain of right hip, initial encounter: Secondary | ICD-10-CM | POA: Diagnosis not present

## 2018-02-19 MED ORDER — IOPAMIDOL (ISOVUE-M 200) INJECTION 41%
15.0000 mL | Freq: Once | INTRAMUSCULAR | Status: AC
  Administered 2018-02-19: 15 mL via INTRA_ARTICULAR

## 2018-02-23 DIAGNOSIS — M25451 Effusion, right hip: Secondary | ICD-10-CM | POA: Diagnosis not present

## 2018-02-23 DIAGNOSIS — M5126 Other intervertebral disc displacement, lumbar region: Secondary | ICD-10-CM | POA: Diagnosis not present

## 2018-02-23 DIAGNOSIS — M7522 Bicipital tendinitis, left shoulder: Secondary | ICD-10-CM | POA: Diagnosis not present

## 2018-02-23 DIAGNOSIS — M9903 Segmental and somatic dysfunction of lumbar region: Secondary | ICD-10-CM | POA: Diagnosis not present

## 2018-02-26 ENCOUNTER — Other Ambulatory Visit: Payer: BLUE CROSS/BLUE SHIELD

## 2018-03-03 DIAGNOSIS — G249 Dystonia, unspecified: Secondary | ICD-10-CM | POA: Diagnosis not present

## 2018-03-03 DIAGNOSIS — M25551 Pain in right hip: Secondary | ICD-10-CM | POA: Diagnosis not present

## 2018-03-18 DIAGNOSIS — M25512 Pain in left shoulder: Secondary | ICD-10-CM | POA: Diagnosis not present

## 2018-03-23 DIAGNOSIS — M25551 Pain in right hip: Secondary | ICD-10-CM | POA: Diagnosis not present

## 2018-04-09 DIAGNOSIS — M25551 Pain in right hip: Secondary | ICD-10-CM | POA: Diagnosis not present

## 2018-04-20 ENCOUNTER — Other Ambulatory Visit: Payer: Self-pay

## 2018-04-20 ENCOUNTER — Ambulatory Visit (INDEPENDENT_AMBULATORY_CARE_PROVIDER_SITE_OTHER): Payer: BLUE CROSS/BLUE SHIELD | Admitting: Podiatry

## 2018-04-20 ENCOUNTER — Encounter: Payer: Self-pay | Admitting: Podiatry

## 2018-04-20 ENCOUNTER — Ambulatory Visit (INDEPENDENT_AMBULATORY_CARE_PROVIDER_SITE_OTHER): Payer: BLUE CROSS/BLUE SHIELD

## 2018-04-20 ENCOUNTER — Other Ambulatory Visit: Payer: Self-pay | Admitting: Podiatry

## 2018-04-20 VITALS — BP 105/65 | HR 70 | Temp 97.5°F | Resp 16

## 2018-04-20 DIAGNOSIS — M79671 Pain in right foot: Secondary | ICD-10-CM

## 2018-04-20 DIAGNOSIS — L600 Ingrowing nail: Secondary | ICD-10-CM

## 2018-04-20 DIAGNOSIS — M7752 Other enthesopathy of left foot: Secondary | ICD-10-CM

## 2018-04-20 DIAGNOSIS — M7751 Other enthesopathy of right foot: Secondary | ICD-10-CM

## 2018-04-20 DIAGNOSIS — M779 Enthesopathy, unspecified: Secondary | ICD-10-CM

## 2018-04-20 DIAGNOSIS — M25572 Pain in left ankle and joints of left foot: Secondary | ICD-10-CM | POA: Diagnosis not present

## 2018-04-20 MED ORDER — TRIAMCINOLONE ACETONIDE 10 MG/ML IJ SUSP
10.0000 mg | Freq: Once | INTRAMUSCULAR | Status: AC
  Administered 2018-04-20: 10 mg

## 2018-04-20 MED ORDER — NEOMYCIN-POLYMYXIN-HC 3.5-10000-1 OT SOLN: OTIC | 0 refills | Status: DC

## 2018-04-20 NOTE — Progress Notes (Signed)
Subjective:   Patient ID: Ashley Adkins, female   DOB: 38 y.o.   MRN: [id number]   HPI Patient presents stating developing quite a bit of discomfort in the big toe joint left and also the ingrown toenail on the right big toe we fix the medial border but she states her other border has really become tender and she like to get that corrected at the same time.  Patient has had a pair of orthotics made and she is not sure if they are doing good and likes to be very active and she is having trouble because of the pain she is in.   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  At this point I have recommended careful injection left 3 mg Kenalog 5 mg Xylocaine around the joint surface and on the right ingrown toenail correction and I explained procedure risk and today I infiltrated the right hallux 60 mg like Marcaine mixture sterile prep applied and remove the lateral border exposing matrix and applied phenol 3 applications 30 seconds followed by alcohol lavage and sterile dressing.  Gave instructions on soaks discussed hallux limitus and we will evaluate her next week and also want to look at her old orthotics which she will bring in with her  X-rays indicate that there is moderate functional hallux limitus left with slight enlargement around the first metatarsal head

## 2018-04-20 NOTE — Patient Instructions (Signed)

## 2018-05-19 DIAGNOSIS — M25851 Other specified joint disorders, right hip: Secondary | ICD-10-CM | POA: Diagnosis not present

## 2018-05-21 DIAGNOSIS — Z1159 Encounter for screening for other viral diseases: Secondary | ICD-10-CM | POA: Diagnosis not present

## 2018-05-26 DIAGNOSIS — R262 Difficulty in walking, not elsewhere classified: Secondary | ICD-10-CM | POA: Diagnosis not present

## 2018-05-26 DIAGNOSIS — M25551 Pain in right hip: Secondary | ICD-10-CM | POA: Diagnosis not present

## 2018-05-27 DIAGNOSIS — M65851 Other synovitis and tenosynovitis, right thigh: Secondary | ICD-10-CM | POA: Diagnosis not present

## 2018-05-27 DIAGNOSIS — M24852 Other specific joint derangements of left hip, not elsewhere classified: Secondary | ICD-10-CM | POA: Diagnosis not present

## 2018-05-27 DIAGNOSIS — M25551 Pain in right hip: Secondary | ICD-10-CM | POA: Diagnosis not present

## 2018-05-27 DIAGNOSIS — M25852 Other specified joint disorders, left hip: Secondary | ICD-10-CM | POA: Diagnosis not present

## 2018-05-27 DIAGNOSIS — M25851 Other specified joint disorders, right hip: Secondary | ICD-10-CM | POA: Diagnosis not present

## 2018-05-27 DIAGNOSIS — M94251 Chondromalacia, right hip: Secondary | ICD-10-CM | POA: Diagnosis not present

## 2018-05-27 DIAGNOSIS — M659 Synovitis and tenosynovitis, unspecified: Secondary | ICD-10-CM | POA: Diagnosis not present

## 2018-05-27 DIAGNOSIS — S73191A Other sprain of right hip, initial encounter: Secondary | ICD-10-CM | POA: Diagnosis not present

## 2018-05-27 DIAGNOSIS — X58XXXA Exposure to other specified factors, initial encounter: Secondary | ICD-10-CM | POA: Diagnosis not present

## 2018-05-29 DIAGNOSIS — M25551 Pain in right hip: Secondary | ICD-10-CM | POA: Diagnosis not present

## 2018-06-02 DIAGNOSIS — M25551 Pain in right hip: Secondary | ICD-10-CM | POA: Diagnosis not present

## 2018-06-04 DIAGNOSIS — M25551 Pain in right hip: Secondary | ICD-10-CM | POA: Diagnosis not present

## 2018-06-09 DIAGNOSIS — M25551 Pain in right hip: Secondary | ICD-10-CM | POA: Diagnosis not present

## 2018-06-11 ENCOUNTER — Ambulatory Visit (INDEPENDENT_AMBULATORY_CARE_PROVIDER_SITE_OTHER): Payer: BC Managed Care – PPO | Admitting: Family Medicine

## 2018-06-11 ENCOUNTER — Other Ambulatory Visit: Payer: Self-pay

## 2018-06-11 ENCOUNTER — Encounter: Payer: Self-pay | Admitting: Family Medicine

## 2018-06-11 DIAGNOSIS — G8929 Other chronic pain: Secondary | ICD-10-CM | POA: Diagnosis not present

## 2018-06-11 DIAGNOSIS — M25522 Pain in left elbow: Secondary | ICD-10-CM | POA: Diagnosis not present

## 2018-06-11 DIAGNOSIS — M25512 Pain in left shoulder: Secondary | ICD-10-CM

## 2018-06-11 DIAGNOSIS — M25551 Pain in right hip: Secondary | ICD-10-CM | POA: Diagnosis not present

## 2018-06-11 NOTE — Progress Notes (Signed)
Office Visit Note   Patient: Ashley Adkins           Date of Birth: 1980-03-15           MRN: [id number] Visit Date: 06/11/2018 Requested by: Carilyn Goodpasture, PA-C [address] [place], [place] [address] PCP: Carilyn Goodpasture, PA-C  Subjective: Chief Complaint  Patient presents with   Left Elbow - Pain    Referred by Ellamae Sia Pain started in elbow 11/2017 with swimming. Pain has radiated up to shoulder and scapula.    HPI: She is a 38 year old right-hand-dominant female left shoulder and elbow pain.  Elbow pain started around November 2019.  At that time she was unable to run for exercise so she was doing a lot more swimming than usual.  She started having pain on the medial side of her elbow and eventually her shoulder started bothering her as well.  She has subsequently had right hip arthroscopy about 2 weeks ago and is now using crutches which has made the pain in the elbow and shoulder even worse.  She has been working with her physical therapist but is only getting temporary relief.  Denies any numbness or tingling in her arm.  Denies any neck pain.              ROS: Denies fevers, chills, respiratory symptoms.  All other systems were reviewed and are negative.  Objective: Vital Signs: There were no vitals taken for this visit.  Physical Exam:  General:  Alert and oriented, in no acute distress. Pulm:  Breathing unlabored. Psy:  Normal mood, congruent affect. Skin: No rash on her skin. Left shoulder: Full active range of motion compared to the right, no adhesive capsulitis.  No significant tenderness over the long head biceps tendon or the AC joint.  Moderate tenderness over the anterior glenohumeral joint and posterior subacromial space.  There is some tenderness in the lateral subacromial space.  Isometric rotator cuff strength is 5/5 throughout, speeds test is negative, impingement tests are equivocal.  No palpable crepitus with active range of  motion. Left elbow: Full range of motion with no effusion.  No tenderness over the distal biceps tendon.  She is tender proximal to the medial epicondyle in the soft tissues.  Imaging: Diagnostic ultrasound left shoulder: Long head biceps tendon is intact, located in its groove.  Subscapularis tendon has normal echotexture.  Supraspinatus tendon has normal appearance as well but there is thickening and some fluid in the subacromial/subdeltoid bursa.  Infraspinatus tendon is intact.  No obvious posterior labrum tear seen with dynamic internal/external rotation. Briefly imaging medial left elbow shows the area of tenderness to be over the median nerve.  The nerve itself appears normal.  Assessment & Plan: 1.  Chronic left shoulder pain, etiology uncertain.  Could be impingement with subacromial bursitis but cannot rule out glenoid labrum tear. -MRI arthrogram to further evaluate.  Injection if no indication for surgery.  2.  Chronic left elbow pain, etiology uncertain.  Question median nerve impingement. -MRI to evaluate.     Procedures: No procedures performed  No notes on file     PMFS History: Patient Active Problem List   Diagnosis Date Noted   Pain in joint of left shoulder 03/18/2018   Muscle tightness 01/12/2018   Weakness of right foot 12/29/2014   Right foot pain 12/29/2014   Neuritis of right foot 12/29/2014   History reviewed. No pertinent past medical history.  History reviewed. No pertinent family  history.  Past Surgical History:  Procedure Laterality Date   HIP SURGERY Left    Social History   Occupational History   Occupation: stay at home mom  Tobacco Use   Smoking status: Never Smoker   Smokeless tobacco: Never Used  Substance and Sexual Activity   Alcohol use: No    Alcohol/week: 0.0 standard drinks   Drug use: No   Sexual activity: Not on file

## 2018-06-16 DIAGNOSIS — M25551 Pain in right hip: Secondary | ICD-10-CM | POA: Diagnosis not present

## 2018-06-17 ENCOUNTER — Telehealth: Payer: Self-pay | Admitting: Family Medicine

## 2018-06-17 NOTE — Telephone Encounter (Signed)
Please advise on this. I do see orders for both.

## 2018-06-17 NOTE — Telephone Encounter (Signed)
Patient called stating the GI called her stated only MRI on elbow w/o contrast.  She thought it was elbow and shoulder with contrast.  Please call patient to advise  [phone]

## 2018-06-18 DIAGNOSIS — M25551 Pain in right hip: Secondary | ICD-10-CM | POA: Diagnosis not present

## 2018-06-23 DIAGNOSIS — M25551 Pain in right hip: Secondary | ICD-10-CM | POA: Diagnosis not present

## 2018-06-23 DIAGNOSIS — Z9889 Other specified postprocedural states: Secondary | ICD-10-CM | POA: Diagnosis not present

## 2018-06-24 DIAGNOSIS — M25551 Pain in right hip: Secondary | ICD-10-CM | POA: Diagnosis not present

## 2018-06-26 DIAGNOSIS — M25551 Pain in right hip: Secondary | ICD-10-CM | POA: Diagnosis not present

## 2018-07-01 DIAGNOSIS — M25551 Pain in right hip: Secondary | ICD-10-CM | POA: Diagnosis not present

## 2018-07-06 ENCOUNTER — Other Ambulatory Visit: Payer: BC Managed Care – PPO

## 2018-07-06 ENCOUNTER — Encounter: Payer: Self-pay | Admitting: Family Medicine

## 2018-07-06 DIAGNOSIS — M25422 Effusion, left elbow: Secondary | ICD-10-CM | POA: Diagnosis not present

## 2018-07-06 DIAGNOSIS — M7702 Medial epicondylitis, left elbow: Secondary | ICD-10-CM | POA: Diagnosis not present

## 2018-07-06 DIAGNOSIS — M24112 Other articular cartilage disorders, left shoulder: Secondary | ICD-10-CM | POA: Diagnosis not present

## 2018-07-06 DIAGNOSIS — M7522 Bicipital tendinitis, left shoulder: Secondary | ICD-10-CM | POA: Diagnosis not present

## 2018-07-06 DIAGNOSIS — M67922 Unspecified disorder of synovium and tendon, left upper arm: Secondary | ICD-10-CM | POA: Diagnosis not present

## 2018-07-06 DIAGNOSIS — M67912 Unspecified disorder of synovium and tendon, left shoulder: Secondary | ICD-10-CM | POA: Diagnosis not present

## 2018-07-06 DIAGNOSIS — M67814 Other specified disorders of tendon, left shoulder: Secondary | ICD-10-CM | POA: Diagnosis not present

## 2018-07-06 DIAGNOSIS — S53492A Other sprain of left elbow, initial encounter: Secondary | ICD-10-CM | POA: Diagnosis not present

## 2018-07-06 DIAGNOSIS — M19012 Primary osteoarthritis, left shoulder: Secondary | ICD-10-CM | POA: Diagnosis not present

## 2018-07-06 DIAGNOSIS — M25512 Pain in left shoulder: Secondary | ICD-10-CM | POA: Diagnosis not present

## 2018-07-07 ENCOUNTER — Telehealth: Payer: Self-pay | Admitting: Family Medicine

## 2018-07-07 NOTE — Telephone Encounter (Signed)
Patient called in wanting to know if MRI results were back. Please advise

## 2018-07-07 NOTE — Telephone Encounter (Signed)
Shoulder MRI report reviewed - does not show a rotator cuff tear. There is some irritation of the tendon, but no indication for surgery.  Could consider a cortisone injection if desired.  Elbow MRI report reviewed - shows strain of the brachialis muscle, but no tendon tears, no indication for surgery.

## 2018-07-07 NOTE — Telephone Encounter (Signed)
I left a full message on the patient's voice mail, giving results. She is to call back if she is interested in having a cortisone injection in the shoulder.

## 2018-07-08 DIAGNOSIS — M25551 Pain in right hip: Secondary | ICD-10-CM | POA: Diagnosis not present

## 2018-07-14 DIAGNOSIS — M25551 Pain in right hip: Secondary | ICD-10-CM | POA: Diagnosis not present

## 2018-07-15 ENCOUNTER — Telehealth: Payer: Self-pay | Admitting: Family Medicine

## 2018-07-15 NOTE — Telephone Encounter (Signed)
Patient called requesting copy of MRI reports.

## 2018-07-15 NOTE — Telephone Encounter (Signed)
IC patient and she asks reports be mailed to her. I placed copies of both Elbow and Shoulder reports in mail today

## 2018-07-15 NOTE — Telephone Encounter (Signed)
I still had the report - waiting to be labeled for scan place.

## 2018-07-20 DIAGNOSIS — M9902 Segmental and somatic dysfunction of thoracic region: Secondary | ICD-10-CM | POA: Diagnosis not present

## 2018-07-20 DIAGNOSIS — M7071 Other bursitis of hip, right hip: Secondary | ICD-10-CM | POA: Diagnosis not present

## 2018-07-20 DIAGNOSIS — M9905 Segmental and somatic dysfunction of pelvic region: Secondary | ICD-10-CM | POA: Diagnosis not present

## 2018-07-20 DIAGNOSIS — M9903 Segmental and somatic dysfunction of lumbar region: Secondary | ICD-10-CM | POA: Diagnosis not present

## 2018-07-22 DIAGNOSIS — M25551 Pain in right hip: Secondary | ICD-10-CM | POA: Diagnosis not present

## 2018-07-24 DIAGNOSIS — M25551 Pain in right hip: Secondary | ICD-10-CM | POA: Diagnosis not present

## 2018-07-27 DIAGNOSIS — M25512 Pain in left shoulder: Secondary | ICD-10-CM | POA: Diagnosis not present

## 2018-07-27 DIAGNOSIS — M25551 Pain in right hip: Secondary | ICD-10-CM | POA: Diagnosis not present

## 2018-07-28 DIAGNOSIS — M9902 Segmental and somatic dysfunction of thoracic region: Secondary | ICD-10-CM | POA: Diagnosis not present

## 2018-07-28 DIAGNOSIS — M7071 Other bursitis of hip, right hip: Secondary | ICD-10-CM | POA: Diagnosis not present

## 2018-07-28 DIAGNOSIS — M9905 Segmental and somatic dysfunction of pelvic region: Secondary | ICD-10-CM | POA: Diagnosis not present

## 2018-07-28 DIAGNOSIS — M9903 Segmental and somatic dysfunction of lumbar region: Secondary | ICD-10-CM | POA: Diagnosis not present

## 2018-07-29 DIAGNOSIS — M25551 Pain in right hip: Secondary | ICD-10-CM | POA: Diagnosis not present

## 2018-07-29 DIAGNOSIS — M25512 Pain in left shoulder: Secondary | ICD-10-CM | POA: Diagnosis not present

## 2018-08-04 ENCOUNTER — Other Ambulatory Visit: Payer: BC Managed Care – PPO

## 2018-08-06 DIAGNOSIS — M25551 Pain in right hip: Secondary | ICD-10-CM | POA: Diagnosis not present

## 2018-08-06 DIAGNOSIS — M25512 Pain in left shoulder: Secondary | ICD-10-CM | POA: Diagnosis not present

## 2018-08-07 DIAGNOSIS — M25551 Pain in right hip: Secondary | ICD-10-CM | POA: Diagnosis not present

## 2018-08-07 DIAGNOSIS — M25512 Pain in left shoulder: Secondary | ICD-10-CM | POA: Diagnosis not present

## 2018-08-09 DIAGNOSIS — H60331 Swimmer's ear, right ear: Secondary | ICD-10-CM | POA: Diagnosis not present

## 2018-08-12 DIAGNOSIS — M25512 Pain in left shoulder: Secondary | ICD-10-CM | POA: Diagnosis not present

## 2018-08-12 DIAGNOSIS — M25551 Pain in right hip: Secondary | ICD-10-CM | POA: Diagnosis not present

## 2018-08-12 DIAGNOSIS — L03019 Cellulitis of unspecified finger: Secondary | ICD-10-CM | POA: Diagnosis not present

## 2018-08-12 DIAGNOSIS — B351 Tinea unguium: Secondary | ICD-10-CM | POA: Diagnosis not present

## 2018-08-12 DIAGNOSIS — B078 Other viral warts: Secondary | ICD-10-CM | POA: Diagnosis not present

## 2018-08-19 DIAGNOSIS — M25551 Pain in right hip: Secondary | ICD-10-CM | POA: Diagnosis not present

## 2018-08-19 DIAGNOSIS — M25512 Pain in left shoulder: Secondary | ICD-10-CM | POA: Diagnosis not present

## 2018-08-21 DIAGNOSIS — M25512 Pain in left shoulder: Secondary | ICD-10-CM | POA: Diagnosis not present

## 2018-08-21 DIAGNOSIS — M25551 Pain in right hip: Secondary | ICD-10-CM | POA: Diagnosis not present

## 2018-09-02 DIAGNOSIS — M25551 Pain in right hip: Secondary | ICD-10-CM | POA: Diagnosis not present

## 2018-09-02 DIAGNOSIS — M25512 Pain in left shoulder: Secondary | ICD-10-CM | POA: Diagnosis not present

## 2018-09-11 DIAGNOSIS — M25551 Pain in right hip: Secondary | ICD-10-CM | POA: Diagnosis not present

## 2018-09-11 DIAGNOSIS — M25512 Pain in left shoulder: Secondary | ICD-10-CM | POA: Diagnosis not present

## 2018-09-15 DIAGNOSIS — M25551 Pain in right hip: Secondary | ICD-10-CM | POA: Diagnosis not present

## 2018-09-15 DIAGNOSIS — Z9889 Other specified postprocedural states: Secondary | ICD-10-CM | POA: Diagnosis not present

## 2018-09-18 DIAGNOSIS — M25512 Pain in left shoulder: Secondary | ICD-10-CM | POA: Diagnosis not present

## 2018-09-18 DIAGNOSIS — M25551 Pain in right hip: Secondary | ICD-10-CM | POA: Diagnosis not present

## 2018-09-23 DIAGNOSIS — B351 Tinea unguium: Secondary | ICD-10-CM | POA: Diagnosis not present

## 2018-09-23 DIAGNOSIS — L03011 Cellulitis of right finger: Secondary | ICD-10-CM | POA: Diagnosis not present

## 2018-09-23 DIAGNOSIS — F411 Generalized anxiety disorder: Secondary | ICD-10-CM | POA: Diagnosis not present

## 2018-09-23 DIAGNOSIS — L609 Nail disorder, unspecified: Secondary | ICD-10-CM | POA: Diagnosis not present

## 2018-09-24 DIAGNOSIS — B351 Tinea unguium: Secondary | ICD-10-CM | POA: Diagnosis not present

## 2018-09-28 DIAGNOSIS — F4322 Adjustment disorder with anxiety: Secondary | ICD-10-CM | POA: Diagnosis not present

## 2018-09-29 DIAGNOSIS — F4322 Adjustment disorder with anxiety: Secondary | ICD-10-CM | POA: Diagnosis not present

## 2018-09-30 DIAGNOSIS — M25512 Pain in left shoulder: Secondary | ICD-10-CM | POA: Diagnosis not present

## 2018-09-30 DIAGNOSIS — M25551 Pain in right hip: Secondary | ICD-10-CM | POA: Diagnosis not present

## 2018-10-09 DIAGNOSIS — Z01419 Encounter for gynecological examination (general) (routine) without abnormal findings: Secondary | ICD-10-CM | POA: Diagnosis not present

## 2018-10-09 DIAGNOSIS — Z124 Encounter for screening for malignant neoplasm of cervix: Secondary | ICD-10-CM | POA: Diagnosis not present

## 2018-10-09 DIAGNOSIS — Z1389 Encounter for screening for other disorder: Secondary | ICD-10-CM | POA: Diagnosis not present

## 2018-10-09 DIAGNOSIS — Z6821 Body mass index (BMI) 21.0-21.9, adult: Secondary | ICD-10-CM | POA: Diagnosis not present

## 2018-10-12 DIAGNOSIS — M9903 Segmental and somatic dysfunction of lumbar region: Secondary | ICD-10-CM | POA: Diagnosis not present

## 2018-10-12 DIAGNOSIS — M9902 Segmental and somatic dysfunction of thoracic region: Secondary | ICD-10-CM | POA: Diagnosis not present

## 2018-10-12 DIAGNOSIS — M7071 Other bursitis of hip, right hip: Secondary | ICD-10-CM | POA: Diagnosis not present

## 2018-10-12 DIAGNOSIS — M9905 Segmental and somatic dysfunction of pelvic region: Secondary | ICD-10-CM | POA: Diagnosis not present

## 2018-10-13 DIAGNOSIS — F4322 Adjustment disorder with anxiety: Secondary | ICD-10-CM | POA: Diagnosis not present

## 2018-10-16 DIAGNOSIS — M9902 Segmental and somatic dysfunction of thoracic region: Secondary | ICD-10-CM | POA: Diagnosis not present

## 2018-10-16 DIAGNOSIS — M9903 Segmental and somatic dysfunction of lumbar region: Secondary | ICD-10-CM | POA: Diagnosis not present

## 2018-10-16 DIAGNOSIS — M9905 Segmental and somatic dysfunction of pelvic region: Secondary | ICD-10-CM | POA: Diagnosis not present

## 2018-10-16 DIAGNOSIS — M7071 Other bursitis of hip, right hip: Secondary | ICD-10-CM | POA: Diagnosis not present

## 2018-10-22 DIAGNOSIS — M7071 Other bursitis of hip, right hip: Secondary | ICD-10-CM | POA: Diagnosis not present

## 2018-10-22 DIAGNOSIS — M9903 Segmental and somatic dysfunction of lumbar region: Secondary | ICD-10-CM | POA: Diagnosis not present

## 2018-10-22 DIAGNOSIS — M9905 Segmental and somatic dysfunction of pelvic region: Secondary | ICD-10-CM | POA: Diagnosis not present

## 2018-10-22 DIAGNOSIS — M9902 Segmental and somatic dysfunction of thoracic region: Secondary | ICD-10-CM | POA: Diagnosis not present

## 2018-10-27 ENCOUNTER — Other Ambulatory Visit: Payer: Self-pay

## 2018-10-27 ENCOUNTER — Ambulatory Visit
Admission: RE | Admit: 2018-10-27 | Discharge: 2018-10-27 | Disposition: A | Discharge status: Home / Self Care | Payer: BC Managed Care – PPO | Source: Ambulatory Visit | Attending: Sports Medicine | Admitting: Sports Medicine

## 2018-10-27 ENCOUNTER — Ambulatory Visit (INDEPENDENT_AMBULATORY_CARE_PROVIDER_SITE_OTHER): Payer: BC Managed Care – PPO | Admitting: Sports Medicine

## 2018-10-27 VITALS — BP 118/80 | Ht 66.0 in | Wt 130.0 lb

## 2018-10-27 DIAGNOSIS — M545 Low back pain, unspecified: Secondary | ICD-10-CM

## 2018-10-27 DIAGNOSIS — G8929 Other chronic pain: Secondary | ICD-10-CM

## 2018-10-27 DIAGNOSIS — F4322 Adjustment disorder with anxiety: Secondary | ICD-10-CM | POA: Diagnosis not present

## 2018-10-27 NOTE — Assessment & Plan Note (Signed)
Given patient's history of worsening with sitting, suspect discogenic pain.  She also reports in her 75s she was told she had a bulging disc at L4-L5.  She does not have any neurologic manifestations at this time.  Discogenic pain likely causing pain in paraspinal musculature.  Given this and pain with flexion, will have patient do McKenzie extension exercises and avoid flexion or any activities that cause her to bend at the waist.  Also advised avoidance of running or pounding motions that would cause pain in the back.  Will obtain 2 view lumbar spine films to assess for narrowing.  No evidence of SI joint dysfunction.  Patient does not have true leg length discrepancy as right medial malleolus only appears to be slightly superior to the left given shear of right ilium, indicated by ASIS superior on right.  Patient to continue with heat, ice, TENS as needed.  Advised avoidance of muscle relaxer as it is sedating and would not help with pain.  Will have patient follow-up in 3 weeks or sooner as needed.

## 2018-10-27 NOTE — Patient Instructions (Signed)
Do you exercises as instructed  Avoid doing anything that causes you to bend at the waste, especially if you have pain  Use heat, ice TENS as needed for pain  We will get x-rays of your back.   Come back in 3 weeks

## 2018-10-27 NOTE — Progress Notes (Signed)
Ashley Adkins is a 38 y.o. female who presents to [facility] today for the following:  Low Back Pain S/P Right Hip Labral Tear Repair in May 2020 Pain have been occurring for the last 1 to 2 weeks, has gotten progressively worse. Describes the pain as a tightness in her lower back bilaterally. States that she feels as though her lower back locks.  Pain worsens with walking, sitting, coughing.  Notes that riding the car also makes pain worse. Pain improves with standing. Has been doing physical therapy for hip, has 1 session of dry needling left of the quadratus lumborum. Has been trying to work on stretching in order to get back to activity. Patient is a triathlete and has only been able to swim due to pain. Reports pain 7/10 in a.m., goes to 5/10 during the day. Notes that if she "turns wrong my body jolts up." Denies any radiation of pain to the legs, no numbness and tingling in the lower extremities, no changes in bowel or bladder functions, no fevers, no weight loss Had similar back pain last year when training for triathlon, lasted for 2 months Swimming every day because she has been having pain with other things Has been cleared to do anything from Labral Surgery  Took valium last two nights to try to sleep from pain Doesn't want to take it, had it from her surgeon Pain was waking her up from sleep in last 3 days  Has also tried heat, ice, Aleve, TENS, has some improvement from these Was going to PT for hip and was getting dry needling of Quadratus Lumborum Was told that she had a short leg on the right and had been wearing lift in right shoe for 2 weeks when she started to have pain, then took it out [employer] in last few weeks as well, she is worried that her back is getting worse from this and has also worsened in the past   PMH reviewed.  ROS as above. Medications reviewed.  Exam:  BP 118/80   Ht 5\' 6"  (1.676 m)   Wt 130 lb (59 kg)   BMI 20.98 kg/m  Gen: Well  NAD MSK:  Lumbar spine: - Inspection: no gross deformity or asymmetry, swelling or ecchymosis.  Right ASIS about 0.5cm superior to left ASIS with right medial malleolus about 0.5cm superior compared to left medial malleolus - Palpation: No TTP over the spinous processes, SI joints b/l  Mild TTP b/l paraspinal musculature in lumbar region - ROM: full active ROM of the lumbar spine in extension without pain, pain with flexion of spine, able to flex to 50 degrees and then endorses pain and tightness in low back and hamstrings.  SI motion slightly less on right than left. - Strength: 5/5 strength of lower extremity in L4-S1 nerve root distributions b/l; normal gait, 5/5 strength with core flexion - Neuro: sensation intact in the L4-S1 nerve root distribution b/l, 2+ L4 and S1 reflexes - Special testing: Negative straight leg raise b/l, negative Stork test b/l, Negative FABER b/l, Negative Gaenselen's b/l, negative Trendelenburg b/l   No results found.   Assessment and Plan: 1) Chronic bilateral low back pain without sciatica Given patient's history of worsening with sitting, suspect discogenic pain.  She also reports in her 37s she was told she had a bulging disc at L4-L5.  She does not have any neurologic manifestations at this time.  Discogenic pain likely causing pain in paraspinal musculature.  Given this and pain with flexion,  will have patient do McKenzie extension exercises and avoid flexion or any activities that cause her to bend at the waist.  Also advised avoidance of running or pounding motions that would cause pain in the back.  Will obtain 2 view lumbar spine films to assess for narrowing.  No evidence of SI joint dysfunction.  Patient does not have true leg length discrepancy as right medial malleolus only appears to be slightly superior to the left given shear of right ilium, indicated by ASIS superior on right.  Patient to continue with heat, ice, TENS as needed.  Advised avoidance of  muscle relaxer as it is sedating and would not help with pain.  Will have patient follow-up in 3 weeks or sooner as needed.   [place] Constable, D.O.  PGY-2 Family Medicine  10/27/2018 12:18 PM   Patient seen and evaluated with the resident.  I agree with the above plan of care.  X-rays were reviewed which show no significant degenerative changes.  Treatment as above and follow-up in 3 weeks.  If symptoms persist consider merits of formal physical therapy.

## 2018-10-28 DIAGNOSIS — Z6821 Body mass index (BMI) 21.0-21.9, adult: Secondary | ICD-10-CM | POA: Diagnosis not present

## 2018-10-28 DIAGNOSIS — Z1389 Encounter for screening for other disorder: Secondary | ICD-10-CM | POA: Diagnosis not present

## 2018-10-28 DIAGNOSIS — M7611 Psoas tendinitis, right hip: Secondary | ICD-10-CM | POA: Diagnosis not present

## 2018-10-28 DIAGNOSIS — Z124 Encounter for screening for malignant neoplasm of cervix: Secondary | ICD-10-CM | POA: Diagnosis not present

## 2018-10-28 DIAGNOSIS — B351 Tinea unguium: Secondary | ICD-10-CM | POA: Diagnosis not present

## 2018-10-30 DIAGNOSIS — M25512 Pain in left shoulder: Secondary | ICD-10-CM | POA: Diagnosis not present

## 2018-10-30 DIAGNOSIS — M25551 Pain in right hip: Secondary | ICD-10-CM | POA: Diagnosis not present

## 2018-11-03 ENCOUNTER — Ambulatory Visit: Payer: BC Managed Care – PPO | Admitting: Sports Medicine

## 2018-11-09 DIAGNOSIS — M25512 Pain in left shoulder: Secondary | ICD-10-CM | POA: Diagnosis not present

## 2018-11-09 DIAGNOSIS — M25551 Pain in right hip: Secondary | ICD-10-CM | POA: Diagnosis not present

## 2018-11-11 DIAGNOSIS — F4322 Adjustment disorder with anxiety: Secondary | ICD-10-CM | POA: Diagnosis not present

## 2018-11-12 DIAGNOSIS — B351 Tinea unguium: Secondary | ICD-10-CM | POA: Diagnosis not present

## 2018-11-12 DIAGNOSIS — B078 Other viral warts: Secondary | ICD-10-CM | POA: Diagnosis not present

## 2018-11-12 DIAGNOSIS — B353 Tinea pedis: Secondary | ICD-10-CM | POA: Diagnosis not present

## 2018-11-12 DIAGNOSIS — L081 Erythrasma: Secondary | ICD-10-CM | POA: Diagnosis not present

## 2018-11-16 DIAGNOSIS — M25512 Pain in left shoulder: Secondary | ICD-10-CM | POA: Diagnosis not present

## 2018-11-16 DIAGNOSIS — M25551 Pain in right hip: Secondary | ICD-10-CM | POA: Diagnosis not present

## 2018-11-17 ENCOUNTER — Ambulatory Visit: Payer: BC Managed Care – PPO | Admitting: Sports Medicine

## 2018-11-17 ENCOUNTER — Ambulatory Visit (INDEPENDENT_AMBULATORY_CARE_PROVIDER_SITE_OTHER): Payer: BC Managed Care – PPO | Admitting: Sports Medicine

## 2018-11-17 ENCOUNTER — Other Ambulatory Visit: Payer: Self-pay

## 2018-11-17 VITALS — BP 116/72 | Ht 66.0 in | Wt 129.0 lb

## 2018-11-17 DIAGNOSIS — G8929 Other chronic pain: Secondary | ICD-10-CM | POA: Diagnosis not present

## 2018-11-17 DIAGNOSIS — M545 Low back pain: Secondary | ICD-10-CM

## 2018-11-17 NOTE — Assessment & Plan Note (Addendum)
Minimal to no improvement in pain and function since last visit, s/p extensive trial of conservative therapy including PT since 06/2018, activity modification, dry needling, ice/heat, and anti-inflammatories.  Continue to suspect her back pain is discogenic in nature (vs facet pathology) with potential radiation into her left hamstring. However it is also very possible that she now has a new onset left hamstring proximal tendinopathy after likely altering her biomechanics to compensate for her back pain, especially with point tenderness on exam.  While her XR lumbar spine was reassuring, will obtain an MRI to further assess for disc and facet pathology that may be contributing to this continued pain impairing her functionality and quality of life failing conservative treatment.  From there, can assess next steps including considerations for injections if warranted.  Recommended continued physical therapy, ice/heat as needed, and avoiding triggers as possible.

## 2018-11-17 NOTE — Patient Instructions (Addendum)
Call the [facility] center to schedule your MRI [phone] [place]

## 2018-11-17 NOTE — Progress Notes (Signed)
Ashley Adkins - 38 y.o. female MRN [id number]  Date of birth: 22-Jan-1980  SUBJECTIVE:   CC: f/u back pain   HPI: Ashley Adkins is an otherwise healthy 38 year old female with a history of a right hip labral tear s/p repair in 05/2018 presenting for follow-up of her bilateral low back pain without radiculopathy.  Last seen in our clinic on 10/20, felt to be discogenic pain at that time and recommended continued physical therapy and stretches at home.  X-ray of lumbar spine on 10/20 showing no bony abnormality with joint space preserved, mild loss of lumbar lordosis.    Today, she has noticed minimal improvement in her pain, and now is experiencing left-sided proximal hamstring pain. Back pain continues to be worse with sitting and leaning forward.  "Tightness "like pain.  She has been doing physical therapy since June and more recently has been focusing specifically on her left-sided concerns and back pain.  Stretches at home for at least 30 minutes daily.  Dry needling helping some.  Swims on a daily basis which helps "loosen her muscles up." Still not able to get back into running and doing her triathlons.  She is quite frustrated that she continues have these pains despite several months of conservative therapies.  Denies any associated fever, swelling, hip instability, popping or locking of her hip, numbness or tingling down her extremities.  ROS: No unexpected weight loss, fever, chills, swelling, instability, muscle pain, numbness/tingling, redness, otherwise see HPI   PMHx - reviewed.  Contributory factors include: Negative PSHx -reviewed.  Contributory factors include: Recent labral repair of right hip in 05/2018. FHx - reviewed.  Contributory factors include:  Negative Social Hx - reviewed. Contributory factors include: Negative Medications - reviewed   PHYSICAL EXAM:  VS: BP:116/72  HR: bpm  TEMP: ( )  RESP:   HT:5\' 6"  (167.6 cm)   WT:129 lb (58.5 kg)  BMI:20.83 PHYSICAL EXAM: Gen:  NAD, alert, cooperative with exam, well-appearing Resp: non-labored Skin: no rashes, normal turgor  Psych:  alert and oriented  Lumbar spine/LE: Inspection: No gross deformity, asymmetry, swelling, or bruising noted. Palpation: Tense bilateral paraspinal musculature around lumbar spine, no TTP over lumbar/thoracic spinous processes, bilateral SI joints, piriformis bilaterally.  Quite tender to palpation of left proximal hamstring insertion site. ROM: Full active ROM of lumbar spine in flexion/extension, rotation, and side bending.  Full ROM in bilateral hip flexion/extension.  Notes pain in back with flexing lumbar spine forward. Strength: 5/5 muscle strength in hip flexion/extension, abduction/abduction.  5/5 muscle strength in knee flexion/extension, however can note reduced strength on left in comparison to right with resisted knee flexion. Neurovascularly intact distally in bilateral lower extremity. Special testing: Negative straight leg raise bilaterally.   ASSESSMENT & PLAN:   Chronic bilateral low back pain without sciatica Minimal to no improvement in pain and function since last visit, s/p extensive trial of conservative therapy including PT since 06/2018, activity modification, dry needling, ice/heat, and anti-inflammatories.  Continue to suspect her back pain is discogenic in nature (vs facet pathology) with potential radiation into her left hamstring. However it is also very possible that she now has a new onset left hamstring proximal tendinopathy after likely altering her biomechanics to compensate for her back pain, especially with point tenderness on exam.  While her XR lumbar spine was reassuring, will obtain an MRI to further assess for disc and facet pathology that may be contributing to this continued pain impairing her functionality and quality of life failing conservative treatment.  From there, can assess next steps including considerations for injections if warranted.   Recommended continued physical therapy, ice/heat as needed, and avoiding triggers as possible.   Follow-up after MRI.  Patriciaann Clan, DO  Family Medicine PGY-2   Patient seen and evaluated with the resident.  I agree with the above plan of care.  Patient has failed conservative treatment including several weeks of formal physical therapy.  We will order an MRI of her lumbar spine to rule out lumbar disc herniation versus facet arthropathy.  She will continue with physical therapy to address her proximal hamstring tendinopathy.  Phone follow-up with MRI results when available.  We will delineate further treatment based on the findings.

## 2018-11-21 ENCOUNTER — Ambulatory Visit (HOSPITAL_BASED_OUTPATIENT_CLINIC_OR_DEPARTMENT_OTHER)
Admission: RE | Admit: 2018-11-21 | Discharge: 2018-11-21 | Disposition: A | Discharge status: Home / Self Care | Payer: BC Managed Care – PPO | Source: Ambulatory Visit | Attending: Sports Medicine | Admitting: Sports Medicine

## 2018-11-21 ENCOUNTER — Other Ambulatory Visit: Payer: Self-pay

## 2018-11-21 DIAGNOSIS — M545 Low back pain: Secondary | ICD-10-CM | POA: Diagnosis not present

## 2018-11-21 DIAGNOSIS — M5126 Other intervertebral disc displacement, lumbar region: Secondary | ICD-10-CM | POA: Diagnosis not present

## 2018-11-21 DIAGNOSIS — G8929 Other chronic pain: Secondary | ICD-10-CM | POA: Diagnosis not present

## 2018-11-24 DIAGNOSIS — F4322 Adjustment disorder with anxiety: Secondary | ICD-10-CM | POA: Diagnosis not present

## 2018-11-25 ENCOUNTER — Telehealth: Payer: Self-pay | Admitting: Sports Medicine

## 2018-11-25 DIAGNOSIS — M25551 Pain in right hip: Secondary | ICD-10-CM | POA: Diagnosis not present

## 2018-11-25 DIAGNOSIS — M25512 Pain in left shoulder: Secondary | ICD-10-CM | POA: Diagnosis not present

## 2018-11-25 NOTE — Telephone Encounter (Signed)
I spoke with Ashley Adkins on the phone today after reviewing the MRI of her lumbar spine.  She has some mild degenerative disc disease at L5-S1 with a shallow broad-based disc protrusion but no significant neural compression and no foraminal stenosis.  I encouraged her to continue with formal physical therapy.  She was inquiring about injections directly into the QL.  While this is not something that I typically perform, I did offer her an IM Depo-Medrol injection.  If this is something she is interested in she will return to the office.  Otherwise, I have reassured her that I do not see any operative pathology.  Follow-up as needed.

## 2018-11-30 DIAGNOSIS — M25551 Pain in right hip: Secondary | ICD-10-CM | POA: Diagnosis not present

## 2018-11-30 DIAGNOSIS — Z20828 Contact with and (suspected) exposure to other viral communicable diseases: Secondary | ICD-10-CM | POA: Diagnosis not present

## 2018-11-30 DIAGNOSIS — M25512 Pain in left shoulder: Secondary | ICD-10-CM | POA: Diagnosis not present

## 2018-12-08 DIAGNOSIS — M545 Low back pain: Secondary | ICD-10-CM | POA: Diagnosis not present

## 2018-12-08 DIAGNOSIS — Z9889 Other specified postprocedural states: Secondary | ICD-10-CM | POA: Diagnosis not present

## 2018-12-09 DIAGNOSIS — M25512 Pain in left shoulder: Secondary | ICD-10-CM | POA: Diagnosis not present

## 2018-12-09 DIAGNOSIS — M25551 Pain in right hip: Secondary | ICD-10-CM | POA: Diagnosis not present

## 2018-12-16 DIAGNOSIS — M25512 Pain in left shoulder: Secondary | ICD-10-CM | POA: Diagnosis not present

## 2018-12-16 DIAGNOSIS — M25551 Pain in right hip: Secondary | ICD-10-CM | POA: Diagnosis not present

## 2018-12-18 DIAGNOSIS — M545 Low back pain: Secondary | ICD-10-CM | POA: Diagnosis not present

## 2018-12-18 DIAGNOSIS — M791 Myalgia, unspecified site: Secondary | ICD-10-CM | POA: Diagnosis not present

## 2018-12-23 DIAGNOSIS — M25512 Pain in left shoulder: Secondary | ICD-10-CM | POA: Diagnosis not present

## 2018-12-23 DIAGNOSIS — M25551 Pain in right hip: Secondary | ICD-10-CM | POA: Diagnosis not present

## 2018-12-28 DIAGNOSIS — Z20828 Contact with and (suspected) exposure to other viral communicable diseases: Secondary | ICD-10-CM | POA: Diagnosis not present

## 2018-12-29 ENCOUNTER — Ambulatory Visit: Payer: BC Managed Care – PPO | Admitting: Physical Medicine and Rehabilitation

## 2018-12-29 ENCOUNTER — Other Ambulatory Visit: Payer: BC Managed Care – PPO

## 2018-12-30 DIAGNOSIS — M25551 Pain in right hip: Secondary | ICD-10-CM | POA: Diagnosis not present

## 2018-12-30 DIAGNOSIS — M25512 Pain in left shoulder: Secondary | ICD-10-CM | POA: Diagnosis not present

## 2019-01-04 DIAGNOSIS — M545 Low back pain: Secondary | ICD-10-CM | POA: Diagnosis not present

## 2019-01-06 ENCOUNTER — Encounter: Payer: Self-pay | Admitting: Family Medicine

## 2019-01-06 ENCOUNTER — Ambulatory Visit (INDEPENDENT_AMBULATORY_CARE_PROVIDER_SITE_OTHER): Payer: BC Managed Care – PPO | Admitting: Family Medicine

## 2019-01-06 ENCOUNTER — Other Ambulatory Visit: Payer: Self-pay

## 2019-01-06 DIAGNOSIS — G8929 Other chronic pain: Secondary | ICD-10-CM | POA: Diagnosis not present

## 2019-01-06 DIAGNOSIS — M545 Low back pain: Secondary | ICD-10-CM

## 2019-01-06 NOTE — Progress Notes (Signed)
Office Visit Note   Patient: Ashley Adkins           Date of Birth: 07/05/1980           MRN: [id number] Visit Date: 01/06/2019 Requested by: Carilyn Goodpasture, PA-C [address] [place],  [place] [address] PCP: Carilyn Goodpasture, PA-C  Subjective: Chief Complaint  Patient presents with  . Lower Back - Pain    Chronic low back pain. Stiffness in the lower back every morning. Cannot run anymore without having much pain in the back the next day. Would like to discuss possibility of PRP for her back.    HPI: She is here with chronic low back pain.  She has a fairly complicated history.  She is an avid long-distance runner.  She started having intermittent back pain many years ago, but a little more than a year ago she started having severe pain in her left lower back.  She went to Dr. Lendon Collar and had some chiropractic treatments.  The adjustments would often make the pain worse.  She then started seeing Lorenda Peck and had some dry needling treatments but it was too expensive so she began seeing [facility] where she continues to go intermittently.  She never gets sciatica pain.  Pain is always in the lumbar area.  This past year she had arthroscopic right hip surgery for acetabular labrum tear.  She has recovered from that for the most part and was released from care.  She denies any groin pain.  She reports that a couple years ago she ruptured her FHL tendon in the right foot.  She thinks it happened while running on trails.  She did not seek treatment immediately but eventually saw a foot surgeon who ordered MRI scan confirming the diagnosis.  It was too late to do surgery.  She has some weakness with pushoff when running related to this.  She recently had a lumbar MRI scan showing tiny disc protrusion at L4-5 and moderate at L5-S1 with degenerative disc disease but no nerve compression.  We reviewed those films in detail today.  She was wondering whether PRP injections in  her spine might benefit her.  She had a palpation guided quadratus lumborum injection on the right few weeks ago by Dr. Modesto Charon.  It gave her some relief from that pain.               ROS: No fevers or chills.  All other systems were reviewed and are negative.  Objective: Vital Signs: There were no vitals taken for this visit.  Physical Exam:  General:  Alert and oriented, in no acute distress. Pulm:  Breathing unlabored. Psy:  Normal mood, congruent affect. Skin: No visible rash. Low back: Leg lengths are equal.  She has no significant scoliosis.  Today she has no tenderness to palpation of the spinous processes.  She does have some tightness in both quadratus lumborum muscles and a little bit of tenderness to palpation of them.  With 1 leg hyperextension, the left SI joint opens up more than the right 1.  She is slightly tender to palpation over the right SI joint.  No significant pain over the ischial tuberosities or the greater trochanters.  No pain in the sciatic notch.  Straight leg raise is negative bilaterally, she has 5/5 hip flexion, knee extension, ankle dorsiflexion, EHL, ankle eversion and ankle plantarflexion strength.  There is slight weakness with right great toe flexion compared to the left.  She  has normal arches at rest but she hyper pronates when she stands and walks.  When she is observed jogging in the hallway with and without shoes, she does not push off quite as strong with the right foot and her right knee and hip rotate inward slightly when she lands on her right foot.  This appears to cause her pelvis to shift slightly to the left. Hip range of motion is hypermobile bilaterally with internal rotation of about 75 degrees and external rotation about 45 degrees on both sides.  She has no pain with passive range of motion.  Imaging: No new imaging today.  Assessment & Plan: 1.  Chronic mechanical low back pain, seems to be more muscular and SI mediated pain.  I think absent  FHL tendon on the right is probably contributing to her altered gait which affects her spine. -I told her I do not think that she would benefit from PRP injections in her spine but that I would discuss with Dr. Ernestina Patches and get his opinion. -I suggested a steel insert in her right shoe to improve her pushoff strength. -I recommended consulting with Dr. Purcell Nails in [place] for possible right-sided SI joint manipulation.     Procedures: No procedures performed  No notes on file     PMFS History: Patient Active Problem List   Diagnosis Date Noted  . Chronic bilateral low back pain without sciatica 10/27/2018  . Pain in joint of left shoulder 03/18/2018  . Muscle tightness 01/12/2018  . Weakness of right foot 12/29/2014  . Right foot pain 12/29/2014  . Neuritis of right foot 12/29/2014   History reviewed. No pertinent past medical history.  History reviewed. No pertinent family history.  Past Surgical History:  Procedure Laterality Date  . HIP SURGERY Left    Social History   Occupational History  . Occupation: stay at home mom  Tobacco Use  . Smoking status: Never Smoker  . Smokeless tobacco: Never Used  Substance and Sexual Activity  . Alcohol use: No    Alcohol/week: 0.0 standard drinks  . Drug use: No  . Sexual activity: Not on file

## 2019-01-06 NOTE — Patient Instructions (Signed)
    Check right SI joint to see if it needs manipulation.  Try steel insert in right shoe.

## 2019-01-07 ENCOUNTER — Encounter: Payer: Self-pay | Admitting: Family Medicine

## 2019-01-07 DIAGNOSIS — M7071 Other bursitis of hip, right hip: Secondary | ICD-10-CM | POA: Diagnosis not present

## 2019-01-07 DIAGNOSIS — M9903 Segmental and somatic dysfunction of lumbar region: Secondary | ICD-10-CM | POA: Diagnosis not present

## 2019-01-07 DIAGNOSIS — M9905 Segmental and somatic dysfunction of pelvic region: Secondary | ICD-10-CM | POA: Diagnosis not present

## 2019-01-07 DIAGNOSIS — M9902 Segmental and somatic dysfunction of thoracic region: Secondary | ICD-10-CM | POA: Diagnosis not present

## 2019-01-20 DIAGNOSIS — M25551 Pain in right hip: Secondary | ICD-10-CM | POA: Diagnosis not present

## 2019-01-20 DIAGNOSIS — M25512 Pain in left shoulder: Secondary | ICD-10-CM | POA: Diagnosis not present

## 2019-02-05 DIAGNOSIS — M25512 Pain in left shoulder: Secondary | ICD-10-CM | POA: Diagnosis not present

## 2019-02-05 DIAGNOSIS — M25551 Pain in right hip: Secondary | ICD-10-CM | POA: Diagnosis not present

## 2019-02-24 DIAGNOSIS — M25512 Pain in left shoulder: Secondary | ICD-10-CM | POA: Diagnosis not present

## 2019-02-24 DIAGNOSIS — M25551 Pain in right hip: Secondary | ICD-10-CM | POA: Diagnosis not present

## 2019-03-02 DIAGNOSIS — H40012 Open angle with borderline findings, low risk, left eye: Secondary | ICD-10-CM | POA: Diagnosis not present

## 2019-03-05 DIAGNOSIS — M25551 Pain in right hip: Secondary | ICD-10-CM | POA: Diagnosis not present

## 2019-03-05 DIAGNOSIS — M25512 Pain in left shoulder: Secondary | ICD-10-CM | POA: Diagnosis not present

## 2019-03-08 ENCOUNTER — Telehealth: Payer: Self-pay | Admitting: Physical Medicine and Rehabilitation

## 2019-03-08 NOTE — Telephone Encounter (Signed)
Looks like he was talking about PRP type injections into her disc.  I think that something she needs to follow-up with him and then he and I could discuss the logistics of doing it.  You can tell her is not something that we routinely do.

## 2019-03-09 NOTE — Telephone Encounter (Signed)
Scheduled for OV with Dr. Hilts. 

## 2019-03-11 DIAGNOSIS — L84 Corns and callosities: Secondary | ICD-10-CM | POA: Diagnosis not present

## 2019-03-11 DIAGNOSIS — B353 Tinea pedis: Secondary | ICD-10-CM | POA: Diagnosis not present

## 2019-03-11 DIAGNOSIS — L080 Pyoderma: Secondary | ICD-10-CM | POA: Diagnosis not present

## 2019-03-11 DIAGNOSIS — L089 Local infection of the skin and subcutaneous tissue, unspecified: Secondary | ICD-10-CM | POA: Diagnosis not present

## 2019-03-12 ENCOUNTER — Ambulatory Visit: Payer: BC Managed Care – PPO | Admitting: Family Medicine

## 2019-03-18 DIAGNOSIS — M9904 Segmental and somatic dysfunction of sacral region: Secondary | ICD-10-CM | POA: Diagnosis not present

## 2019-03-18 DIAGNOSIS — M5136 Other intervertebral disc degeneration, lumbar region: Secondary | ICD-10-CM | POA: Diagnosis not present

## 2019-03-22 DIAGNOSIS — M9904 Segmental and somatic dysfunction of sacral region: Secondary | ICD-10-CM | POA: Diagnosis not present

## 2019-03-22 DIAGNOSIS — M5136 Other intervertebral disc degeneration, lumbar region: Secondary | ICD-10-CM | POA: Diagnosis not present

## 2019-03-24 DIAGNOSIS — M9904 Segmental and somatic dysfunction of sacral region: Secondary | ICD-10-CM | POA: Diagnosis not present

## 2019-03-24 DIAGNOSIS — M5136 Other intervertebral disc degeneration, lumbar region: Secondary | ICD-10-CM | POA: Diagnosis not present

## 2019-03-30 ENCOUNTER — Ambulatory Visit (INDEPENDENT_AMBULATORY_CARE_PROVIDER_SITE_OTHER): Payer: BC Managed Care – PPO | Admitting: Physician Assistant

## 2019-03-30 ENCOUNTER — Other Ambulatory Visit: Payer: Self-pay

## 2019-03-30 ENCOUNTER — Encounter: Payer: Self-pay | Admitting: Physician Assistant

## 2019-03-30 VITALS — BP 118/72 | HR 64 | Temp 98.2°F | Ht 66.0 in | Wt 128.4 lb

## 2019-03-30 DIAGNOSIS — Z136 Encounter for screening for cardiovascular disorders: Secondary | ICD-10-CM

## 2019-03-30 DIAGNOSIS — R768 Other specified abnormal immunological findings in serum: Secondary | ICD-10-CM | POA: Diagnosis not present

## 2019-03-30 DIAGNOSIS — G4709 Other insomnia: Secondary | ICD-10-CM

## 2019-03-30 DIAGNOSIS — M79641 Pain in right hand: Secondary | ICD-10-CM | POA: Diagnosis not present

## 2019-03-30 DIAGNOSIS — F419 Anxiety disorder, unspecified: Secondary | ICD-10-CM

## 2019-03-30 DIAGNOSIS — M79642 Pain in left hand: Secondary | ICD-10-CM

## 2019-03-30 DIAGNOSIS — Z13220 Encounter for screening for lipoid disorders: Secondary | ICD-10-CM

## 2019-03-30 DIAGNOSIS — Z0001 Encounter for general adult medical examination with abnormal findings: Secondary | ICD-10-CM | POA: Diagnosis not present

## 2019-03-30 DIAGNOSIS — Z114 Encounter for screening for human immunodeficiency virus [HIV]: Secondary | ICD-10-CM | POA: Diagnosis not present

## 2019-03-30 DIAGNOSIS — R21 Rash and other nonspecific skin eruption: Secondary | ICD-10-CM

## 2019-03-30 DIAGNOSIS — E785 Hyperlipidemia, unspecified: Secondary | ICD-10-CM

## 2019-03-30 DIAGNOSIS — M9904 Segmental and somatic dysfunction of sacral region: Secondary | ICD-10-CM | POA: Diagnosis not present

## 2019-03-30 DIAGNOSIS — M5136 Other intervertebral disc degeneration, lumbar region: Secondary | ICD-10-CM | POA: Diagnosis not present

## 2019-03-30 LAB — COMPREHENSIVE METABOLIC PANEL
ALT: 10 U/L (ref 0–35)
AST: 22 U/L (ref 0–37)
Albumin: 4.4 g/dL (ref 3.5–5.2)
Alkaline Phosphatase: 56 U/L (ref 39–117)
BUN: 22 mg/dL (ref 6–23)
CO2: 28 mEq/L (ref 19–32)
Calcium: 9.1 mg/dL (ref 8.4–10.5)
Chloride: 102 mEq/L (ref 96–112)
Creatinine, Ser: 0.79 mg/dL (ref 0.40–1.20)
GFR: 80.46 mL/min (ref >60.00)
Glucose, Bld: 81 mg/dL (ref 70–99)
Potassium: 4.4 mEq/L (ref 3.5–5.1)
Sodium: 137 mEq/L (ref 135–145)
Total Bilirubin: 0.4 mg/dL (ref 0.2–1.2)
Total Protein: 6.6 g/dL (ref 6.0–8.3)

## 2019-03-30 LAB — LIPID PANEL
Cholesterol: 219 mg/dL — ABNORMAL HIGH (ref 0–200)
HDL: 95.7 mg/dL (ref >39.00)
LDL Cholesterol: 115 mg/dL — ABNORMAL HIGH (ref 0–99)
NonHDL: 123.5
Total CHOL/HDL Ratio: 2
Triglycerides: 43 mg/dL (ref 0.0–149.0)
VLDL: 8.6 mg/dL (ref 0.0–40.0)

## 2019-03-30 LAB — CBC WITH DIFFERENTIAL/PLATELET
Basophils Absolute: 0 10*3/uL (ref 0.0–0.1)
Basophils Relative: 0.8 % (ref 0.0–3.0)
Eosinophils Absolute: 0 10*3/uL (ref 0.0–0.7)
Eosinophils Relative: 0.2 % (ref 0.0–5.0)
HCT: 37.9 % (ref 36.0–46.0)
Hemoglobin: 12.3 g/dL (ref 12.0–15.0)
Lymphocytes Relative: 13.5 % (ref 12.0–46.0)
Lymphs Abs: 0.7 10*3/uL (ref 0.7–4.0)
MCHC: 32.6 g/dL (ref 30.0–36.0)
MCV: 86.3 fl (ref 78.0–100.0)
Monocytes Absolute: 0.4 10*3/uL (ref 0.1–1.0)
Monocytes Relative: 7.6 % (ref 3.0–12.0)
Neutro Abs: 4.3 10*3/uL (ref 1.4–7.7)
Neutrophils Relative %: 77.9 % — ABNORMAL HIGH (ref 43.0–77.0)
Platelets: 204 10*3/uL (ref 150.0–400.0)
RBC: 4.39 Mil/uL (ref 3.87–5.11)
RDW: 13.9 % (ref 11.5–15.5)
WBC: 5.5 10*3/uL (ref 4.0–10.5)

## 2019-03-30 MED ORDER — BUSPIRONE HCL 5 MG PO TABS: 5.0000 mg | ORAL_TABLET | Freq: Three times a day (TID) | ORAL | 1 refills | Status: DC | PRN

## 2019-03-30 MED ORDER — DIAZEPAM 5 MG PO TABS: 5.0000 mg | ORAL_TABLET | Freq: Every evening | ORAL | 1 refills | Status: DC | PRN

## 2019-03-30 NOTE — Patient Instructions (Signed)
It was great to see you!  You will be contacted about your referral to rheumatology.  Please get a mammogram.  Please get your covid vaccine.  Please consider using Buspar as needed for anxiety, follow-up with me if you have any concerns.  Please go to the lab for blood work.   Our office will call you with your results unless you have chosen to receive results via MyChart.  If your blood work is normal we will follow-up each year for physicals and as scheduled for chronic medical problems.  If anything is abnormal we will treat accordingly and get you in for a follow-up.  Take care,  [facility] Maintenance, Female Adopting a healthy lifestyle and getting preventive care are important in promoting health and wellness. Ask your health care provider about:  The right schedule for you to have regular tests and exams.  Things you can do on your own to prevent diseases and keep yourself healthy. What should I know about diet, weight, and exercise? Eat a healthy diet   Eat a diet that includes plenty of vegetables, fruits, low-fat dairy products, and lean protein.  Do not eat a lot of foods that are high in solid fats, added sugars, or sodium. Maintain a healthy weight Body mass index (BMI) is used to identify weight problems. It estimates body fat based on height and weight. Your health care provider can help determine your BMI and help you achieve or maintain a healthy weight. Get regular exercise Get regular exercise. This is one of the most important things you can do for your health. Most adults should:  Exercise for at least 150 minutes each week. The exercise should increase your heart rate and make you sweat (moderate-intensity exercise).  Do strengthening exercises at least twice a week. This is in addition to the moderate-intensity exercise.  Spend less time sitting. Even light physical activity can be beneficial. Watch cholesterol and blood lipids Have  your blood tested for lipids and cholesterol at 41 years of age, then have this test every 5 years. Have your cholesterol levels checked more often if:  Your lipid or cholesterol levels are high.  You are older than 41 years of age.  You are at high risk for heart disease. What should I know about cancer screening? Depending on your health history and family history, you may need to have cancer screening at various ages. This may include screening for:  Breast cancer.  Cervical cancer.  Colorectal cancer.  Skin cancer.  Lung cancer. What should I know about heart disease, diabetes, and high blood pressure? Blood pressure and heart disease  High blood pressure causes heart disease and increases the risk of stroke. This is more likely to develop in people who have high blood pressure readings, are of African descent, or are overweight.  Have your blood pressure checked: ? Every 3-5 years if you are 8-23 years of age. ? Every year if you are 28 years old or older. Diabetes Have regular diabetes screenings. This checks your fasting blood sugar level. Have the screening done:  Once every three years after age 53 if you are at a normal weight and have a low risk for diabetes.  More often and at a younger age if you are overweight or have a high risk for diabetes. What should I know about preventing infection? Hepatitis B If you have a higher risk for hepatitis B, you should be screened for this virus. Talk with your  health care provider to find out if you are at risk for hepatitis B infection. Hepatitis C Testing is recommended for:  Everyone born from [birth year] through 1967.  Anyone with known risk factors for hepatitis C. Sexually transmitted infections (STIs)  Get screened for STIs, including gonorrhea and chlamydia, if: ? You are sexually active and are younger than 41 years of age. ? You are older than 41 years of age and your health care provider tells you that you are at  risk for this type of infection. ? Your sexual activity has changed since you were last screened, and you are at increased risk for chlamydia or gonorrhea. Ask your health care provider if you are at risk.  Ask your health care provider about whether you are at high risk for HIV. Your health care provider may recommend a prescription medicine to help prevent HIV infection. If you choose to take medicine to prevent HIV, you should first get tested for HIV. You should then be tested every 3 months for as long as you are taking the medicine. Pregnancy  If you are about to stop having your period (premenopausal) and you may become pregnant, seek counseling before you get pregnant.  Take 400 to 800 micrograms (mcg) of folic acid every day if you become pregnant.  Ask for birth control (contraception) if you want to prevent pregnancy. Osteoporosis and menopause Osteoporosis is a disease in which the bones lose minerals and strength with aging. This can result in bone fractures. If you are 55 years old or older, or if you are at risk for osteoporosis and fractures, ask your health care provider if you should:  Be screened for bone loss.  Take a calcium or vitamin D supplement to lower your risk of fractures.  Be given hormone replacement therapy (HRT) to treat symptoms of menopause. Follow these instructions at home: Lifestyle  Do not use any products that contain nicotine or tobacco, such as cigarettes, e-cigarettes, and chewing tobacco. If you need help quitting, ask your health care provider.  Do not use street drugs.  Do not share needles.  Ask your health care provider for help if you need support or information about quitting drugs. Alcohol use  Do not drink alcohol if: ? Your health care provider tells you not to drink. ? You are pregnant, may be pregnant, or are planning to become pregnant.  If you drink alcohol: ? Limit how much you use to 0-1 drink a day. ? Limit intake if you  are breastfeeding.  Be aware of how much alcohol is in your drink. In the [place] one drink equals one 12 oz bottle of beer (355 mL), one 5 oz glass of wine (148 mL), or one 1 oz glass of hard liquor (44 mL). General instructions  Schedule regular health, dental, and eye exams.  Stay current with your vaccines.  Tell your health care provider if: ? You often feel depressed. ? You have ever been abused or do not feel safe at home. Summary  Adopting a healthy lifestyle and getting preventive care are important in promoting health and wellness.  Follow your health care provider's instructions about healthy diet, exercising, and getting tested or screened for diseases.  Follow your health care provider's instructions on monitoring your cholesterol and blood pressure. This information is not intended to replace advice given to you by your health care provider. Make sure you discuss any questions you have with your health care provider. Document Revised: 12/17/2017 Document Reviewed:  12/17/2017 Elsevier Patient Education  2020 Elsevier Inc.  

## 2019-03-30 NOTE — Progress Notes (Signed)
Ashley Adkins is a 39 y.o. female here to Establish care and physical.  I acted as a Neurosurgeon for [employer], PA-C Corky Mull, LPN  History of Present Illness:   Chief Complaint  Patient presents with  . Establish Care  . Annual Exam  . Arthritis    Acute Concerns: Hand pain, rash, positive ANA-- Pt would like to discuss and be tested for autoimmune disease.  Per chart review she actually did have a positive ANA in December 29, 2014, but she never saw a rheumatologist.  Both hands are stiff and swollen in the morning. Left worse than right.  She has red knuckles.  She is unable to get her wedding ring on due to joint swelling.  Hands are really dry. Has rashes on her skin and her dermatologist told her that it looks like it was some autoimmune disorder, but this was just based on the appearance, states that she never had a biopsy.  She states that she had a round circular rashes on her extremities.  She endorses nail changes as well. Anxiety --uncontrolled.  She was on Prozac 10 mg daily prior to pregnancy.  Although she did not like the side effects of it, she felt like it was a great tool for anxiety.  She feels like she could possibly use medication again.  She does not feel like she needs it every day.  She wants to avoid medications to cause weight gain.  She denies suicidal or homicidal ideation.  Chronic Issues: HLD -- Patient has a history of this, was recently checked at OB/GYN, but would like a recheck today.  She has never been on medications. Insomnia --will occasionally take Valium as a muscle relaxant and also helps her with her sleep.  She has never abused this medication.  She needs a small refill today.  Health Maintenance: Immunizations --getting Covid vaccine this week Colonoscopy -- 2018, due in 2028 Mammogram -- Never PAP -- UTD, 10/2018 normal per pt. Will obtain record Bone Density -- N/A Diet --eats healthy Sleep habits --overall okay, will take Valium as  needed but very rare Exercise --triathlete Weight -- Weight: 128 lb 6.1 oz (58.2 kg)  Mood --anxiety, see above Alcohol --no concerning intake Tobacco --never smoker  Depression screen [facility] 2/9 03/30/2019  Decreased Interest 0  Down, Depressed, Hopeless 0  PHQ - 2 Score 0    GAD 7 : Generalized Anxiety Score 03/30/2019  Nervous, Anxious, on Edge 2  Control/stop worrying 1  Worry too much - different things 1  Trouble relaxing 1  Restless 1  Easily annoyed or irritable 2  Afraid - awful might happen 1  Total GAD 7 Score 9  Anxiety Difficulty Somewhat difficult     Other providers/specialists: Patient Care Team: Jarold Motto, [place] as PCP - General (Physician Assistant) Andrena Mews, DO as Consulting Physician (Sports Medicine) Benson Setting, PT as Physical Therapist (Physical Therapy)   Past Medical History:  Diagnosis Date  . Colon polyps 2018  . History of chicken pox   . Hyperlipidemia    never on medication  . Vaginal delivery 2011, 2014     Social History   Socioeconomic History  . Marital status: Married    Spouse name: Mat Carne  . Number of children: 1  . Years of education: 3  . Highest education level: Not on file  Occupational History  . Occupation: stay at home mom  Tobacco Use  . Smoking status: Never Smoker  . Smokeless  tobacco: Never Used  Substance and Sexual Activity  . Alcohol use: No    Alcohol/week: 0.0 standard drinks  . Drug use: No  . Sexual activity: Yes    Birth control/protection: None  Other Topics Concern  . Not on file  Social History Narrative   History of being a Personal assistant   Two children   Social Determinants of Health   Financial Resource Strain:   . Difficulty of Paying Living Expenses:   Food Insecurity:   . Worried About Programme researcher, broadcasting/film/video in the Last Year:   . Barista in the Last Year:   Transportation Needs:   . Freight forwarder (Medical):   Marland Kitchen Lack of Transportation  (Non-Medical):   Physical Activity:   . Days of Exercise per Week:   . Minutes of Exercise per Session:   Stress:   . Feeling of Stress :   Social Connections:   . Frequency of Communication with Friends and Family:   . Frequency of Social Gatherings with Friends and Family:   . Attends Religious Services:   . Active Member of Clubs or Organizations:   . Attends Banker Meetings:   Marland Kitchen Marital Status:   Intimate Partner Violence:   . Fear of Current or Ex-Partner:   . Emotionally Abused:   Marland Kitchen Physically Abused:   . Sexually Abused:     Past Surgical History:  Procedure Laterality Date  . HIP SURGERY Left   . HIP SURGERY Bilateral    Labrial tear, left 2007, right 2020 done arthroscopic    Family History  Problem Relation Age of Onset  . Asthma Mother   . Heart attack Maternal Grandmother   . Heart attack Maternal Grandfather   . Heart attack Paternal Grandfather     Allergies  Allergen Reactions  . Latex Rash     Current Medications:   Current Outpatient Medications:  .  Cholecalciferol (VITAMIN D3) 125 MCG (5000 UT) CAPS, Take 1 capsule by mouth daily., Disp: , Rfl:  .  clindamycin (CLINDAGEL) 1 % gel, Apply BID to toes/feet. Apply 1st, Disp: , Rfl:  .  loratadine (CLARITIN) 10 MG tablet, Take 10 mg by mouth daily as needed for allergies., Disp: , Rfl:  .  MELATONIN GUMMIES PO, Take 3 mg by mouth at bedtime., Disp: , Rfl:  .  Misc Natural Products (GLUCOSAMINE CHOND MSM FORMULA PO), Take 1 capsule by mouth daily. 1000 mg, 1200 mg, 1000 mg, Disp: , Rfl:  .  Multiple Vitamin (MULTIVITAMIN) tablet, Take 1 tablet by mouth daily., Disp: , Rfl:  .  Probiotic Product (PROBIOTIC DAILY PO), Take by mouth daily. 85 BILLION, Disp: , Rfl:  .  VITAMIN K PO, Take 45 mcg by mouth daily., Disp: , Rfl:  .  busPIRone (BUSPAR) 5 MG tablet, Take 1 tablet (5 mg total) by mouth 3 (three) times daily as needed., Disp: 30 tablet, Rfl: 1 .  diazepam (VALIUM) 5 MG tablet, Take 1  tablet (5 mg total) by mouth at bedtime as needed for anxiety., Disp: 20 tablet, Rfl: 1   Review of Systems:   Review of Systems  Constitutional: Negative for chills, fever, malaise/fatigue and weight loss.  HENT: Negative for hearing loss, sinus pain and sore throat.   Respiratory: Negative for cough and hemoptysis.   Cardiovascular: Negative for chest pain, palpitations, leg swelling and PND.  Gastrointestinal: Negative for abdominal pain, constipation, diarrhea, heartburn, nausea and vomiting.  Genitourinary: Negative  for dysuria, frequency and urgency.  Musculoskeletal: Positive for joint pain. Negative for back pain, myalgias and neck pain.  Skin: Positive for rash. Negative for itching.  Neurological: Negative for dizziness, tingling, seizures and headaches.  Endo/Heme/Allergies: Negative for polydipsia.  Psychiatric/Behavioral: Negative for depression. The patient is nervous/anxious.     Vitals:   Vitals:   03/30/19 1348  BP: 118/72  Pulse: 64  Temp: 98.2 F (36.8 C)  TempSrc: Temporal  SpO2: 98%  Weight: 128 lb 6.1 oz (58.2 kg)  Height: 5\' 6"  (1.676 m)      Body mass index is 20.72 kg/m.  Physical Exam:   Physical Exam Vitals and nursing note reviewed.  Constitutional:      General: She is not in acute distress.    Appearance: Normal appearance. She is well-developed. She is not ill-appearing or toxic-appearing.  HENT:     Head: Normocephalic and atraumatic.     Right Ear: Tympanic membrane, ear canal and external ear normal. Tympanic membrane is not erythematous, retracted or bulging.     Left Ear: Tympanic membrane, ear canal and external ear normal. Tympanic membrane is not erythematous, retracted or bulging.  Eyes:     General: Lids are normal.     Conjunctiva/sclera: Conjunctivae normal.     Pupils: Pupils are equal, round, and reactive to light.  Neck:     Trachea: Trachea normal.  Cardiovascular:     Rate and Rhythm: Normal rate and regular rhythm.      Heart sounds: Normal heart sounds, S1 normal and S2 normal.  Pulmonary:     Effort: Pulmonary effort is normal. No tachypnea or respiratory distress.     Breath sounds: Normal breath sounds. No decreased breath sounds, wheezing, rhonchi or rales.  Abdominal:     General: Bowel sounds are normal.     Palpations: Abdomen is soft.     Tenderness: There is no abdominal tenderness.  Musculoskeletal:        General: Normal range of motion.     Cervical back: Full passive range of motion without pain.     Comments: Erythematous finger joints with swelling  Lymphadenopathy:     Cervical: No cervical adenopathy.  Skin:    General: Skin is warm and dry.  Neurological:     Mental Status: She is alert.     GCS: GCS eye subscore is 4. GCS verbal subscore is 5. GCS motor subscore is 6.     Cranial Nerves: No cranial nerve deficit.     Sensory: No sensory deficit.     Deep Tendon Reflexes: Reflexes are normal and symmetric.  Psychiatric:        Speech: Speech normal.        Behavior: Behavior normal. Behavior is cooperative.       Assessment and Plan:   Cresta was seen today for establish care, annual exam and arthritis.  Diagnoses and all orders for this visit:  Encounter for general adult medical examination with abnormal findings Today patient counseled on age appropriate routine health concerns for screening and prevention, each reviewed and up to date or declined. Immunizations reviewed and up to date or declined. Labs ordered and reviewed. Risk factors for depression reviewed and negative. Hearing function and visual acuity are intact. ADLs screened and addressed as needed. Functional ability and level of safety reviewed and appropriate. Education, counseling and referrals performed based on assessed risks today. Patient provided with a copy of personalized plan for preventive services.  Encounter for  lipid screening for cardiovascular disease -     Lipid  panel  Anxiety Uncontrolled.  We are going to trial Buspar 5 mg as needed.  Asked her to follow-up with me if this medication is not effective for her.  -     CBC with Differential/Platelet -     Comprehensive metabolic panel  Bilateral hand pain; Rash; Positive ANA (antinuclear antibody) Unclear etiology.  We will refer to dermatology per patient request.  Hyperlipidemia, unspecified hyperlipidemia type Update labs today, calculate ASCVD, and recommend further interventions as needed.  Screening for HIV (human immunodeficiency virus) -     HIV Antibody (routine testing w rflx)  Other insomnia Will order Valium as needed.  I reviewed the controlled substance database, patient is compliant with 1 prescriber.    Other orders -     busPIRone (BUSPAR) 5 MG tablet; Take 1 tablet (5 mg total) by mouth 3 (three) times daily as needed. -     diazepam (VALIUM) 5 MG tablet; Take 1 tablet (5 mg total) by mouth at bedtime as needed for anxiety.  . Reviewed expectations re: course of current medical issues. . Discussed self-management of symptoms. . Outlined signs and symptoms indicating need for more acute intervention. . Patient verbalized understanding and all questions were answered. . See orders for this visit as documented in the electronic medical record. . Patient received an After-Visit Summary.  CMA or LPN served as scribe during this visit. History, Physical, and Plan performed by medical provider. The above documentation has been reviewed and is accurate and complete.   Jarold Motto, PA-C

## 2019-03-31 ENCOUNTER — Other Ambulatory Visit: Payer: Self-pay | Admitting: Physician Assistant

## 2019-03-31 ENCOUNTER — Telehealth: Payer: Self-pay

## 2019-03-31 LAB — HIV ANTIBODY (ROUTINE TESTING W REFLEX): HIV 1&2 Ab, 4th Generation: NONREACTIVE

## 2019-03-31 NOTE — Telephone Encounter (Signed)
Please see message from pt about referral. 

## 2019-03-31 NOTE — Telephone Encounter (Signed)
Patient calling in regarding a referral to Dr. Deanne Coffer. Patient would like a call back as soon as possible. Patient provided the fax number [phone]

## 2019-04-01 ENCOUNTER — Ambulatory Visit: Payer: BC Managed Care – PPO | Attending: Internal Medicine

## 2019-04-01 ENCOUNTER — Telehealth: Payer: Self-pay | Admitting: Physician Assistant

## 2019-04-01 DIAGNOSIS — Z23 Encounter for immunization: Secondary | ICD-10-CM

## 2019-04-01 DIAGNOSIS — M9904 Segmental and somatic dysfunction of sacral region: Secondary | ICD-10-CM | POA: Diagnosis not present

## 2019-04-01 DIAGNOSIS — M5136 Other intervertebral disc degeneration, lumbar region: Secondary | ICD-10-CM | POA: Diagnosis not present

## 2019-04-01 NOTE — Telephone Encounter (Signed)
ROI fax to Dr. [facility] for patient records

## 2019-04-05 DIAGNOSIS — M9904 Segmental and somatic dysfunction of sacral region: Secondary | ICD-10-CM | POA: Diagnosis not present

## 2019-04-05 DIAGNOSIS — M5136 Other intervertebral disc degeneration, lumbar region: Secondary | ICD-10-CM | POA: Diagnosis not present

## 2019-04-06 DIAGNOSIS — M2550 Pain in unspecified joint: Secondary | ICD-10-CM | POA: Diagnosis not present

## 2019-04-06 DIAGNOSIS — R768 Other specified abnormal immunological findings in serum: Secondary | ICD-10-CM | POA: Diagnosis not present

## 2019-04-06 DIAGNOSIS — M549 Dorsalgia, unspecified: Secondary | ICD-10-CM | POA: Diagnosis not present

## 2019-04-06 DIAGNOSIS — D8989 Other specified disorders involving the immune mechanism, not elsewhere classified: Secondary | ICD-10-CM | POA: Diagnosis not present

## 2019-04-06 DIAGNOSIS — M79641 Pain in right hand: Secondary | ICD-10-CM | POA: Diagnosis not present

## 2019-04-06 DIAGNOSIS — M79643 Pain in unspecified hand: Secondary | ICD-10-CM | POA: Diagnosis not present

## 2019-04-06 DIAGNOSIS — R21 Rash and other nonspecific skin eruption: Secondary | ICD-10-CM | POA: Diagnosis not present

## 2019-04-06 DIAGNOSIS — M79642 Pain in left hand: Secondary | ICD-10-CM | POA: Diagnosis not present

## 2019-04-06 DIAGNOSIS — M7989 Other specified soft tissue disorders: Secondary | ICD-10-CM | POA: Diagnosis not present

## 2019-04-06 LAB — HEPATITIS B SURFACE ANTIGEN: Hepatitis B Surface Ag: NEGATIVE

## 2019-04-08 DIAGNOSIS — M9904 Segmental and somatic dysfunction of sacral region: Secondary | ICD-10-CM | POA: Diagnosis not present

## 2019-04-08 DIAGNOSIS — M5136 Other intervertebral disc degeneration, lumbar region: Secondary | ICD-10-CM | POA: Diagnosis not present

## 2019-04-12 DIAGNOSIS — M5136 Other intervertebral disc degeneration, lumbar region: Secondary | ICD-10-CM | POA: Diagnosis not present

## 2019-04-12 DIAGNOSIS — M9904 Segmental and somatic dysfunction of sacral region: Secondary | ICD-10-CM | POA: Diagnosis not present

## 2019-04-14 DIAGNOSIS — M5136 Other intervertebral disc degeneration, lumbar region: Secondary | ICD-10-CM | POA: Diagnosis not present

## 2019-04-14 DIAGNOSIS — M9904 Segmental and somatic dysfunction of sacral region: Secondary | ICD-10-CM | POA: Diagnosis not present

## 2019-04-19 DIAGNOSIS — M5136 Other intervertebral disc degeneration, lumbar region: Secondary | ICD-10-CM | POA: Diagnosis not present

## 2019-04-19 DIAGNOSIS — M9904 Segmental and somatic dysfunction of sacral region: Secondary | ICD-10-CM | POA: Diagnosis not present

## 2019-04-20 ENCOUNTER — Telehealth: Payer: Self-pay | Admitting: Physician Assistant

## 2019-04-20 DIAGNOSIS — M7989 Other specified soft tissue disorders: Secondary | ICD-10-CM | POA: Diagnosis not present

## 2019-04-20 DIAGNOSIS — M79643 Pain in unspecified hand: Secondary | ICD-10-CM | POA: Diagnosis not present

## 2019-04-20 DIAGNOSIS — M79641 Pain in right hand: Secondary | ICD-10-CM

## 2019-04-20 DIAGNOSIS — R21 Rash and other nonspecific skin eruption: Secondary | ICD-10-CM | POA: Diagnosis not present

## 2019-04-20 DIAGNOSIS — M2550 Pain in unspecified joint: Secondary | ICD-10-CM | POA: Diagnosis not present

## 2019-04-20 DIAGNOSIS — M79642 Pain in left hand: Secondary | ICD-10-CM

## 2019-04-20 DIAGNOSIS — R768 Other specified abnormal immunological findings in serum: Secondary | ICD-10-CM

## 2019-04-20 NOTE — Telephone Encounter (Signed)
Thank you Lupita Leash, its been sent.

## 2019-04-20 NOTE — Telephone Encounter (Signed)
Please place a new referral for her.

## 2019-04-20 NOTE — Telephone Encounter (Signed)
Judeth Cornfield, I put the new referral in.

## 2019-04-20 NOTE — Telephone Encounter (Signed)
[facility], please disregard the note.  I sent that to you by mistake.  Lupita Leash, can I get a new referral placed for her please.

## 2019-04-20 NOTE — Telephone Encounter (Signed)
Pt called stating she was not able to get in with Dr. Mervyn Skeeters over at [facility]. Requested to have referral sent over to Dr. Corliss Skains at [facility]. Please advise.

## 2019-04-22 ENCOUNTER — Encounter: Payer: Self-pay | Admitting: Physician Assistant

## 2019-04-22 DIAGNOSIS — M9904 Segmental and somatic dysfunction of sacral region: Secondary | ICD-10-CM | POA: Diagnosis not present

## 2019-04-22 DIAGNOSIS — M5136 Other intervertebral disc degeneration, lumbar region: Secondary | ICD-10-CM | POA: Diagnosis not present

## 2019-04-23 DIAGNOSIS — L03011 Cellulitis of right finger: Secondary | ICD-10-CM | POA: Diagnosis not present

## 2019-04-23 DIAGNOSIS — L409 Psoriasis, unspecified: Secondary | ICD-10-CM | POA: Diagnosis not present

## 2019-04-27 ENCOUNTER — Ambulatory Visit: Payer: BC Managed Care – PPO | Attending: Internal Medicine

## 2019-04-27 DIAGNOSIS — Z23 Encounter for immunization: Secondary | ICD-10-CM

## 2019-04-27 DIAGNOSIS — M5136 Other intervertebral disc degeneration, lumbar region: Secondary | ICD-10-CM | POA: Diagnosis not present

## 2019-04-27 DIAGNOSIS — M9904 Segmental and somatic dysfunction of sacral region: Secondary | ICD-10-CM | POA: Diagnosis not present

## 2019-04-27 NOTE — Progress Notes (Signed)
   Covid-19 Vaccination Clinic  Name:  Ashley Adkins    MRN: [id number] DOB: 1980/06/02  04/27/2019  [name] was observed post Covid-19 immunization for 15 minutes without incident. She was provided with Vaccine Information Sheet and instruction to access the V-Safe system.   [name] was instructed to call 911 with any severe reactions post vaccine: Marland Kitchen Difficulty breathing  . Swelling of face and throat  . A fast heartbeat  . A bad rash all over body  . Dizziness and weakness   Immunizations Administered    Name Date Dose VIS Date Route   Pfizer COVID-19 Vaccine 04/27/2019  8:42 AM 0.3 mL 03/03/2018 Intramuscular   Manufacturer: [employer], [employer]   Lot: [id number]   NDC: [address]-3

## 2019-04-29 DIAGNOSIS — M5136 Other intervertebral disc degeneration, lumbar region: Secondary | ICD-10-CM | POA: Diagnosis not present

## 2019-04-29 DIAGNOSIS — M9904 Segmental and somatic dysfunction of sacral region: Secondary | ICD-10-CM | POA: Diagnosis not present

## 2019-04-30 DIAGNOSIS — M5127 Other intervertebral disc displacement, lumbosacral region: Secondary | ICD-10-CM | POA: Diagnosis not present

## 2019-04-30 DIAGNOSIS — M25551 Pain in right hip: Secondary | ICD-10-CM | POA: Diagnosis not present

## 2019-04-30 DIAGNOSIS — M25512 Pain in left shoulder: Secondary | ICD-10-CM | POA: Diagnosis not present

## 2019-05-10 DIAGNOSIS — M5136 Other intervertebral disc degeneration, lumbar region: Secondary | ICD-10-CM | POA: Diagnosis not present

## 2019-05-10 DIAGNOSIS — M9904 Segmental and somatic dysfunction of sacral region: Secondary | ICD-10-CM | POA: Diagnosis not present

## 2019-05-24 DIAGNOSIS — M9904 Segmental and somatic dysfunction of sacral region: Secondary | ICD-10-CM | POA: Diagnosis not present

## 2019-05-24 DIAGNOSIS — M5136 Other intervertebral disc degeneration, lumbar region: Secondary | ICD-10-CM | POA: Diagnosis not present

## 2019-05-26 DIAGNOSIS — M25551 Pain in right hip: Secondary | ICD-10-CM | POA: Diagnosis not present

## 2019-05-26 DIAGNOSIS — M25512 Pain in left shoulder: Secondary | ICD-10-CM | POA: Diagnosis not present

## 2019-06-01 DIAGNOSIS — L739 Follicular disorder, unspecified: Secondary | ICD-10-CM | POA: Diagnosis not present

## 2019-06-01 DIAGNOSIS — L723 Sebaceous cyst: Secondary | ICD-10-CM | POA: Diagnosis not present

## 2019-06-01 DIAGNOSIS — B353 Tinea pedis: Secondary | ICD-10-CM | POA: Diagnosis not present

## 2019-06-02 DIAGNOSIS — M25512 Pain in left shoulder: Secondary | ICD-10-CM | POA: Diagnosis not present

## 2019-06-02 DIAGNOSIS — M25551 Pain in right hip: Secondary | ICD-10-CM | POA: Diagnosis not present

## 2019-06-03 DIAGNOSIS — M5136 Other intervertebral disc degeneration, lumbar region: Secondary | ICD-10-CM | POA: Diagnosis not present

## 2019-06-03 DIAGNOSIS — M9904 Segmental and somatic dysfunction of sacral region: Secondary | ICD-10-CM | POA: Diagnosis not present

## 2019-06-04 ENCOUNTER — Telehealth: Payer: Self-pay | Admitting: Physician Assistant

## 2019-06-04 NOTE — Telephone Encounter (Signed)
Patient called in stating her and [name] have discussed taking 10 mg of Prozac for anxiety and patient feels like she is ready to take it.

## 2019-06-08 ENCOUNTER — Other Ambulatory Visit: Payer: Self-pay | Admitting: Physician Assistant

## 2019-06-08 MED ORDER — FLUOXETINE HCL 10 MG PO CAPS: 10.0000 mg | ORAL_CAPSULE | Freq: Every day | ORAL | 1 refills | Status: DC

## 2019-06-08 NOTE — Telephone Encounter (Signed)
Spoke to pt told her Rx for Prozac 10 mg daily was sent to pharmacy and [name] would like you to follow up in 4-6 weeks. Pt verbalized understanding.

## 2019-06-08 NOTE — Telephone Encounter (Signed)
I will send in Prozac 10 mg daily. Please follow-up with me in 4-6 weeks to see how she is doing.

## 2019-06-11 ENCOUNTER — Other Ambulatory Visit: Payer: Self-pay

## 2019-06-11 ENCOUNTER — Ambulatory Visit (INDEPENDENT_AMBULATORY_CARE_PROVIDER_SITE_OTHER): Payer: BC Managed Care – PPO | Admitting: Family Medicine

## 2019-06-11 ENCOUNTER — Ambulatory Visit: Payer: Self-pay

## 2019-06-11 DIAGNOSIS — M79652 Pain in left thigh: Secondary | ICD-10-CM | POA: Diagnosis not present

## 2019-06-11 NOTE — Progress Notes (Signed)
Office Visit Note   Patient: Ashley Adkins           Date of Birth: [birth year]           MRN: [id number] Visit Date: 06/11/2019 Requested by: Inda Coke, [address] [place],  [place] [address] PCP: Inda Coke, [place]  Subjective: Chief Complaint  Patient presents with   pain in prox hamstring x 3 yrs & worsening    HPI: She is here with left hamstring area pain.  Symptoms started about 3 years ago.  Intermittent pain with running, worse in the past few months.  She has been doing dry needling in therapy, she tried chiropractic.  Most recently she was working with [employer] who felt that it was probably her abductor magnus muscle rather than her hamstring.  Of note, she is also working with rheumatology.  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  Denies saddle anesthesia, incontinence.  No numbness or weakness in her leg.               ROS:   All other systems were reviewed and are negative.  Objective: Vital Signs: There were no vitals taken for this visit.  Physical Exam:  General:  Alert and oriented, in no acute distress. Pulm:  Breathing unlabored. Psy:  Normal mood, congruent affect. Skin: No bruising or erythema. Left leg: She has negative stork test.  No tenderness at the ischial tuberosity.  She is tender in the medial thigh along the abductor magnus muscle.  It is tight compared to the right side.  There is no atrophy of her hamstring, strength in her lower extremity is normal and DTRs are 2+.   Imaging: US Guided Needle Placement - No Linked Charges  Result Date: 06/11/2019 Limited diagnostic ultrasound of her left medial thigh reveals no areas of muscle tears or hyperemia on power Doppler imaging.  Sono palpation over the area of tenderness reveals that it is the abductor magnus muscle.   Assessment & Plan: 1.  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  Cannot rule out pain from L5-S1 disc protrusion contacting the left S1 nerve. -We will try trigger point injection today.  We will also refer her to Dr. Ernestina Patches for consideration of an epidural injection. -Encouraged her to [facility] exercises.  She will avoid hyperflexion at the waist.     Procedures: Left medial thigh injection: After sterile prep with Betadine, injected 5 cc 1% lidocaine without epinephrine and 40 mg methylprednisolone into the abductor magnus muscle belly.  She had good relief during the anesthetic phase.   PMFS History: Patient Active Problem List   Diagnosis Date Noted   Chronic bilateral low back pain without sciatica 10/27/2018   Pain in joint of left shoulder 03/18/2018   Muscle tightness 01/12/2018   Weakness of right foot 12/29/2014   Right foot pain 12/29/2014   Neuritis of right foot 12/29/2014   Past Medical History:  Diagnosis Date   ANA positive    Arthritis    Colon polyps 2018   History of chicken pox    Hyperlipidemia    never on medication   Vaginal delivery 2011, 2014    Family History  Problem Relation Age of Onset   Asthma Mother    Heart attack Maternal Grandmother    Heart attack Maternal Grandfather  Heart attack Paternal Grandfather     Past Surgical History:  Procedure Laterality Date   HIP SURGERY Left    HIP SURGERY Bilateral    Labrial tear, left 2007, right 2020 done arthroscopic   Social History   Occupational History   Occupation: stay at home mom  Tobacco Use   Smoking status: Never Smoker   Smokeless tobacco: Never Used  Substance and Sexual Activity   Alcohol use: No    Alcohol/week: 0.0 standard drinks   Drug use: No   Sexual activity: Yes    Birth control/protection: None

## 2019-06-17 ENCOUNTER — Encounter: Payer: Self-pay | Admitting: Family Medicine

## 2019-06-17 MED ORDER — GABAPENTIN 100 MG PO CAPS: ORAL_CAPSULE | ORAL | 3 refills | Status: DC

## 2019-06-18 ENCOUNTER — Encounter: Payer: Self-pay | Admitting: Physical Medicine and Rehabilitation

## 2019-06-22 DIAGNOSIS — M25512 Pain in left shoulder: Secondary | ICD-10-CM | POA: Diagnosis not present

## 2019-06-22 DIAGNOSIS — M25551 Pain in right hip: Secondary | ICD-10-CM | POA: Diagnosis not present

## 2019-06-28 DIAGNOSIS — M25551 Pain in right hip: Secondary | ICD-10-CM | POA: Diagnosis not present

## 2019-06-28 DIAGNOSIS — M25512 Pain in left shoulder: Secondary | ICD-10-CM | POA: Diagnosis not present

## 2019-06-29 DIAGNOSIS — M9904 Segmental and somatic dysfunction of sacral region: Secondary | ICD-10-CM | POA: Diagnosis not present

## 2019-06-29 DIAGNOSIS — M5136 Other intervertebral disc degeneration, lumbar region: Secondary | ICD-10-CM | POA: Diagnosis not present

## 2019-07-01 DIAGNOSIS — M7071 Other bursitis of hip, right hip: Secondary | ICD-10-CM | POA: Diagnosis not present

## 2019-07-01 DIAGNOSIS — M9902 Segmental and somatic dysfunction of thoracic region: Secondary | ICD-10-CM | POA: Diagnosis not present

## 2019-07-01 DIAGNOSIS — M9905 Segmental and somatic dysfunction of pelvic region: Secondary | ICD-10-CM | POA: Diagnosis not present

## 2019-07-01 DIAGNOSIS — M9903 Segmental and somatic dysfunction of lumbar region: Secondary | ICD-10-CM | POA: Diagnosis not present

## 2019-07-02 ENCOUNTER — Other Ambulatory Visit: Payer: Self-pay | Admitting: Physician Assistant

## 2019-07-02 NOTE — Progress Notes (Deleted)
Office Visit Note  Patient: Ashley Adkins             Date of Birth: September 27, 1980           MRN: [id number]             PCP: Jarold Motto, PA Referring: Jarold Motto, PA Visit Date: 07/09/2019 Occupation: @GUAROCC @  Subjective:  No chief complaint on file.   History of Present Illness: Ashley Adkins is a 39 y.o. female ***   Activities of Daily Living:  Patient reports morning stiffness for *** {minute/hour:19697}.   Patient {ACTIONS;DENIES/REPORTS:21021675::"Denies"} nocturnal pain.  Difficulty dressing/grooming: {ACTIONS;DENIES/REPORTS:21021675::"Denies"} Difficulty climbing stairs: {ACTIONS;DENIES/REPORTS:21021675::"Denies"} Difficulty getting out of chair: {ACTIONS;DENIES/REPORTS:21021675::"Denies"} Difficulty using hands for taps, buttons, cutlery, and/or writing: {ACTIONS;DENIES/REPORTS:21021675::"Denies"}  No Rheumatology ROS completed.   PMFS History:  Patient Active Problem List   Diagnosis Date Noted  . Chronic bilateral low back pain without sciatica 10/27/2018  . Pain in joint of left shoulder 03/18/2018  . Muscle tightness 01/12/2018  . Weakness of right foot 12/29/2014  . Right foot pain 12/29/2014  . Neuritis of right foot 12/29/2014    Past Medical History:  Diagnosis Date  . ANA positive   . Arthritis   . Colon polyps 2018  . History of chicken pox   . Hyperlipidemia    never on medication  . Vaginal delivery 2011, 2014    Family History  Problem Relation Age of Onset  . Asthma Mother   . Heart attack Maternal Grandmother   . Heart attack Maternal Grandfather   . Heart attack Paternal Grandfather    Past Surgical History:  Procedure Laterality Date  . HIP SURGERY Left   . HIP SURGERY Bilateral    Labrial tear, left 2007, right 2020 done arthroscopic   Social History   Social History Narrative   History of being a 2008   Two children   Immunization History  Administered Date(s) Administered  . PFIZER  SARS-COV-2 Vaccination 04/01/2019, 04/27/2019  . Tdap 06/29/2012     Objective: Vital Signs: There were no vitals taken for this visit.   Physical Exam   Musculoskeletal Exam: ***  CDAI Exam: CDAI Score: -- Patient Global: --; Provider Global: -- Swollen: --; Tender: -- Joint Exam 07/09/2019   No joint exam has been documented for this visit   There is currently no information documented on the homunculus. Go to the Rheumatology activity and complete the homunculus joint exam.  Investigation: No additional findings.  Imaging: 09/09/2019 Guided Needle Placement - No Linked Charges  Result Date: 06/11/2019 Limited diagnostic ultrasound of her left medial thigh reveals no areas of muscle tears or hyperemia on power Doppler imaging.  Sono palpation over the area of tenderness reveals that it is the abductor magnus muscle.   Recent Labs: Lab Results  Component Value Date   WBC 5.5 03/30/2019   HGB 12.3 03/30/2019   PLT 204.0 03/30/2019   NA 137 03/30/2019   K 4.4 03/30/2019   CL 102 03/30/2019   CO2 28 03/30/2019   GLUCOSE 81 03/30/2019   BUN 22 03/30/2019   CREATININE 0.79 03/30/2019   BILITOT 0.4 03/30/2019   ALKPHOS 56 03/30/2019   AST 22 03/30/2019   ALT 10 03/30/2019   PROT 6.6 03/30/2019   ALBUMIN 4.4 03/30/2019   CALCIUM 9.1 03/30/2019   GFRAA 93 12/29/2014    Speciality Comments: No specialty comments available.  Procedures:  No procedures performed Allergies: Latex   Assessment / Plan:  Visit Diagnoses: No diagnosis found.  Orders: No orders of the defined types were placed in this encounter.  No orders of the defined types were placed in this encounter.   Face-to-face time spent with patient was *** minutes. Greater than 50% of time was spent in counseling and coordination of care.  Follow-Up Instructions: No follow-ups on file.   Ofilia Neas, PA-C  Note - This record has been created using Dragon software.  Chart creation errors have been  sought, but may not always  have been located. Such creation errors do not reflect on  the standard of medical care.

## 2019-07-06 DIAGNOSIS — R760 Raised antibody titer: Secondary | ICD-10-CM | POA: Diagnosis not present

## 2019-07-06 DIAGNOSIS — L409 Psoriasis, unspecified: Secondary | ICD-10-CM | POA: Diagnosis not present

## 2019-07-06 DIAGNOSIS — M2550 Pain in unspecified joint: Secondary | ICD-10-CM | POA: Diagnosis not present

## 2019-07-07 DIAGNOSIS — M9905 Segmental and somatic dysfunction of pelvic region: Secondary | ICD-10-CM | POA: Diagnosis not present

## 2019-07-07 DIAGNOSIS — M7071 Other bursitis of hip, right hip: Secondary | ICD-10-CM | POA: Diagnosis not present

## 2019-07-07 DIAGNOSIS — M9902 Segmental and somatic dysfunction of thoracic region: Secondary | ICD-10-CM | POA: Diagnosis not present

## 2019-07-07 DIAGNOSIS — M9903 Segmental and somatic dysfunction of lumbar region: Secondary | ICD-10-CM | POA: Diagnosis not present

## 2019-07-08 ENCOUNTER — Encounter: Payer: Self-pay | Admitting: Physical Medicine and Rehabilitation

## 2019-07-08 ENCOUNTER — Ambulatory Visit: Payer: Self-pay

## 2019-07-08 ENCOUNTER — Other Ambulatory Visit: Payer: Self-pay

## 2019-07-08 ENCOUNTER — Ambulatory Visit (INDEPENDENT_AMBULATORY_CARE_PROVIDER_SITE_OTHER): Payer: BC Managed Care – PPO | Admitting: Physical Medicine and Rehabilitation

## 2019-07-08 VITALS — BP 117/74 | HR 48

## 2019-07-08 DIAGNOSIS — M5116 Intervertebral disc disorders with radiculopathy, lumbar region: Secondary | ICD-10-CM

## 2019-07-08 MED ORDER — METHYLPREDNISOLONE ACETATE 80 MG/ML IJ SUSP
40.0000 mg | Freq: Once | INTRAMUSCULAR | Status: AC
  Administered 2019-07-08: 40 mg

## 2019-07-08 NOTE — Progress Notes (Signed)
Ashley Adkins - 39 y.o. female MRN [id number]  Date of birth: 1980-02-08  Office Visit Note: Visit Date: 07/08/2019 PCP: Jarold Motto, PA Referred by: Jarold Motto, [place]  Subjective: Chief Complaint  Patient presents with  . Lower Back - Pain  . Left Leg - Pain  . Left Ankle - Tingling  . Left Foot - Tingling   HPI:  Ashley Adkins is a 39 y.o. female who comes in today at the request of Dr. Lavada Mesi for planned Left S1-2 Lumbar epidural steroid injection with fluoroscopic guidance.  The patient has failed conservative care including home exercise, medications, time and activity modification.  This injection will be diagnostic and hopefully therapeutic.  Please see requesting physician notes for further details and justification.  MRI reviewed with images and spine model.  MRI reviewed in the note below.    ROS Otherwise per HPI.  Assessment & Plan: Visit Diagnoses:  1. Radiculopathy due to lumbar intervertebral disc disorder     Plan: No additional findings.   Meds & Orders:  Meds ordered this encounter  Medications  . methylPREDNISolone acetate (DEPO-MEDROL) injection 40 mg    Orders Placed This Encounter  Procedures  . XR C-ARM NO REPORT  . Epidural Steroid injection    Follow-up: Return if symptoms worsen or fail to improve.   Procedures: No procedures performed  S1 Lumbosacral Transforaminal Epidural Steroid Injection - Sub-Pedicular Approach with Fluoroscopic Guidance   Patient: Ashley Adkins      Date of Birth: 1980/12/11 MRN: [id number] PCP: Jarold Motto, PA      Visit Date: 07/08/2019   Universal Protocol:    Date/Time: 07/02/215:41 AM  Consent Given By: the patient  Position:  PRONE  Additional Comments: Vital signs were monitored before and after the procedure. Patient was prepped and draped in the usual sterile fashion. The correct patient, procedure, and site was verified.   Injection Procedure Details:  Procedure Site  One Meds Administered:  Meds ordered this encounter  Medications  . methylPREDNISolone acetate (DEPO-MEDROL) injection 40 mg    Laterality: Left  Location/Site:  S1 Foramen   Needle size: 22 ga.  Needle type: Spinal  Needle Placement: Transforaminal  Findings:   -Comments: Excellent flow of contrast along the nerve and into the epidural space.  Epidurogram: Contrast epidurogram showed no nerve root cut off or restricted flow pattern.  Procedure Details: After squaring off the sacral end-plate to get a true AP view, the C-arm was positioned so that the best possible view of the S1 foramen was visualized. The soft tissues overlying this structure were infiltrated with 2-3 ml. of 1% Lidocaine without Epinephrine.    The spinal needle was inserted toward the target using a "trajectory" view along the fluoroscope beam.  Under AP and lateral visualization, the needle was advanced so it did not puncture dura. Biplanar projections were used to confirm position. Aspiration was confirmed to be negative for CSF and/or blood. A 1-2 ml. volume of Isovue-250 was injected and flow of contrast was noted at each level. Radiographs were obtained for documentation purposes.   After attaining the desired flow of contrast documented above, a 0.5 to 1.0 ml test dose of 0.25% Marcaine was injected into each respective transforaminal space.  The patient was observed for 90 seconds post injection.  After no sensory deficits were reported, and normal lower extremity motor function was noted,   the above injectate was administered so that equal amounts of the injectate were placed at each foramen (  level) into the transforaminal epidural space.   Additional Comments:  The patient tolerated the procedure well Dressing: Band-Aid with 2 x 2 sterile gauze    Post-procedure details: Patient was observed during the procedure. Post-procedure instructions were reviewed.  Patient left the clinic in stable  condition.     Clinical History: MRI LUMBAR SPINE WITHOUT CONTRAST  TECHNIQUE: Multiplanar, multisequence MR imaging of the lumbar spine was performed. No intravenous contrast was administered.  COMPARISON:  Radiographs 10/27/2018  FINDINGS: Segmentation: There are five lumbar type vertebral bodies. The last full intervertebral disc space is labeled L5-S1.  Alignment:  Normal  Vertebrae: Endplate reactive changes at L5-S1 but no bone lesions or fractures.  Conus medullaris and cauda equina: Conus extends to the L1 level. Conus and cauda equina appear normal.  Paraspinal and other soft tissues: No significant findings.  Disc levels:  L1-2: No significant findings.  L2-3: No significant findings.  L3-4: No significant findings.  L4-5: Annular fissure and tiny central disc protrusion but no neural compression. There is also a small right foraminal annular fissure but no associated disc protrusion or foraminal stenosis.  L5-S1: Degenerative disc disease with disc space narrowing and disc desiccation. There is a shallow disc protrusion with slight flattening of the ventral thecal sac but no significant spinal or foraminal stenosis.  IMPRESSION: 1. Small annular fissures at L4-5 with a tiny central disc protrusion but no neural compression. 2. Degenerative disc disease at L5-S1 with a shallow broad-based disc protrusion but no significant neural compression and no foraminal stenosis.   Electronically Signed   By: Rudie Meyer M.D.   On: 11/21/2018 17:46     Objective:  VS:  HT:    WT:   BMI:     BP:117/74  HR:(!) 48bpm  TEMP: ( )  RESP:  Physical Exam Constitutional:      General: She is not in acute distress.    Appearance: Normal appearance. She is not ill-appearing.  HENT:     Head: Normocephalic and atraumatic.     Right Ear: External ear normal.     Left Ear: External ear normal.  Eyes:     Extraocular Movements: Extraocular  movements intact.  Cardiovascular:     Rate and Rhythm: Normal rate.     Pulses: Normal pulses.  Musculoskeletal:     Right lower leg: No edema.     Left lower leg: No edema.     Comments: Patient has good distal strength with no pain over the greater trochanters.  No clonus or focal weakness.  Skin:    Findings: No erythema, lesion or rash.  Neurological:     General: No focal deficit present.     Mental Status: She is alert and oriented to person, place, and time.     Sensory: No sensory deficit.     Motor: No weakness or abnormal muscle tone.     Coordination: Coordination normal.  Psychiatric:        Mood and Affect: Mood normal.        Behavior: Behavior normal.      Imaging: XR C-ARM NO REPORT  Result Date: 07/08/2019 Please see Notes tab for imaging impression.

## 2019-07-08 NOTE — Progress Notes (Signed)
Pt states pain on the left side of the lower back that radiates into the left buttocks and down the back of the left leg all the way with tingling in the left ankle and foot. Pt states pain started about 2 years ago. Swimming and running makes pain worse. Dry needling, massage machine, and stretching helps with pain.   .Numeric Pain Rating Scale and Functional Assessment Average Pain 6   In the last MONTH (on 0-10 scale) has pain interfered with the following?  1. General activity like being  able to carry out your everyday physical activities such as walking, climbing stairs, carrying groceries, or moving a chair?  Rating(10)   +Driver, -BT, -Dye Allergies.

## 2019-07-09 ENCOUNTER — Ambulatory Visit: Payer: BC Managed Care – PPO | Admitting: Rheumatology

## 2019-07-09 NOTE — Procedures (Signed)
S1 Lumbosacral Transforaminal Epidural Steroid Injection - Sub-Pedicular Approach with Fluoroscopic Guidance   Patient: Ashley Adkins      Date of Birth: 1980/12/08 MRN: [id number] PCP: Jarold Motto, PA      Visit Date: 07/08/2019   Universal Protocol:    Date/Time: 07/02/215:41 AM  Consent Given By: the patient  Position:  PRONE  Additional Comments: Vital signs were monitored before and after the procedure. Patient was prepped and draped in the usual sterile fashion. The correct patient, procedure, and site was verified.   Injection Procedure Details:  Procedure Site One Meds Administered:  Meds ordered this encounter  Medications  . methylPREDNISolone acetate (DEPO-MEDROL) injection 40 mg    Laterality: Left  Location/Site:  S1 Foramen   Needle size: 22 ga.  Needle type: Spinal  Needle Placement: Transforaminal  Findings:   -Comments: Excellent flow of contrast along the nerve and into the epidural space.  Epidurogram: Contrast epidurogram showed no nerve root cut off or restricted flow pattern.  Procedure Details: After squaring off the sacral end-plate to get a true AP view, the C-arm was positioned so that the best possible view of the S1 foramen was visualized. The soft tissues overlying this structure were infiltrated with 2-3 ml. of 1% Lidocaine without Epinephrine.    The spinal needle was inserted toward the target using a "trajectory" view along the fluoroscope beam.  Under AP and lateral visualization, the needle was advanced so it did not puncture dura. Biplanar projections were used to confirm position. Aspiration was confirmed to be negative for CSF and/or blood. A 1-2 ml. volume of Isovue-250 was injected and flow of contrast was noted at each level. Radiographs were obtained for documentation purposes.   After attaining the desired flow of contrast documented above, a 0.5 to 1.0 ml test dose of 0.25% Marcaine was injected into each respective  transforaminal space.  The patient was observed for 90 seconds post injection.  After no sensory deficits were reported, and normal lower extremity motor function was noted,   the above injectate was administered so that equal amounts of the injectate were placed at each foramen (level) into the transforaminal epidural space.   Additional Comments:  The patient tolerated the procedure well Dressing: Band-Aid with 2 x 2 sterile gauze    Post-procedure details: Patient was observed during the procedure. Post-procedure instructions were reviewed.  Patient left the clinic in stable condition.

## 2019-07-15 DIAGNOSIS — M7071 Other bursitis of hip, right hip: Secondary | ICD-10-CM | POA: Diagnosis not present

## 2019-07-15 DIAGNOSIS — M9905 Segmental and somatic dysfunction of pelvic region: Secondary | ICD-10-CM | POA: Diagnosis not present

## 2019-07-15 DIAGNOSIS — M9903 Segmental and somatic dysfunction of lumbar region: Secondary | ICD-10-CM | POA: Diagnosis not present

## 2019-07-15 DIAGNOSIS — M9902 Segmental and somatic dysfunction of thoracic region: Secondary | ICD-10-CM | POA: Diagnosis not present

## 2019-07-19 DIAGNOSIS — M25512 Pain in left shoulder: Secondary | ICD-10-CM | POA: Diagnosis not present

## 2019-07-19 DIAGNOSIS — M25551 Pain in right hip: Secondary | ICD-10-CM | POA: Diagnosis not present

## 2019-07-29 DIAGNOSIS — M9905 Segmental and somatic dysfunction of pelvic region: Secondary | ICD-10-CM | POA: Diagnosis not present

## 2019-07-29 DIAGNOSIS — M9903 Segmental and somatic dysfunction of lumbar region: Secondary | ICD-10-CM | POA: Diagnosis not present

## 2019-07-29 DIAGNOSIS — M7071 Other bursitis of hip, right hip: Secondary | ICD-10-CM | POA: Diagnosis not present

## 2019-07-29 DIAGNOSIS — M9902 Segmental and somatic dysfunction of thoracic region: Secondary | ICD-10-CM | POA: Diagnosis not present

## 2019-07-30 ENCOUNTER — Ambulatory Visit: Payer: BC Managed Care – PPO | Admitting: Rheumatology

## 2019-08-09 DIAGNOSIS — M7071 Other bursitis of hip, right hip: Secondary | ICD-10-CM | POA: Diagnosis not present

## 2019-08-09 DIAGNOSIS — M9902 Segmental and somatic dysfunction of thoracic region: Secondary | ICD-10-CM | POA: Diagnosis not present

## 2019-08-09 DIAGNOSIS — M9903 Segmental and somatic dysfunction of lumbar region: Secondary | ICD-10-CM | POA: Diagnosis not present

## 2019-08-09 DIAGNOSIS — M9905 Segmental and somatic dysfunction of pelvic region: Secondary | ICD-10-CM | POA: Diagnosis not present

## 2019-08-10 DIAGNOSIS — L920 Granuloma annulare: Secondary | ICD-10-CM | POA: Diagnosis not present

## 2019-08-10 DIAGNOSIS — L723 Sebaceous cyst: Secondary | ICD-10-CM | POA: Diagnosis not present

## 2019-08-10 DIAGNOSIS — B079 Viral wart, unspecified: Secondary | ICD-10-CM | POA: Diagnosis not present

## 2019-08-12 DIAGNOSIS — M9905 Segmental and somatic dysfunction of pelvic region: Secondary | ICD-10-CM | POA: Diagnosis not present

## 2019-08-12 DIAGNOSIS — M9902 Segmental and somatic dysfunction of thoracic region: Secondary | ICD-10-CM | POA: Diagnosis not present

## 2019-08-12 DIAGNOSIS — M9903 Segmental and somatic dysfunction of lumbar region: Secondary | ICD-10-CM | POA: Diagnosis not present

## 2019-08-12 DIAGNOSIS — M7071 Other bursitis of hip, right hip: Secondary | ICD-10-CM | POA: Diagnosis not present

## 2019-08-18 DIAGNOSIS — M25512 Pain in left shoulder: Secondary | ICD-10-CM | POA: Diagnosis not present

## 2019-08-18 DIAGNOSIS — M25551 Pain in right hip: Secondary | ICD-10-CM | POA: Diagnosis not present

## 2019-08-24 NOTE — Progress Notes (Deleted)
Office Visit Note  Patient: Ashley Adkins             Date of Birth: January 19, 1980           MRN: [id number]             PCP: Jarold Motto, PA Referring: Jarold Motto, PA Visit Date: 09/01/2019 Occupation: @GUAROCC @  Subjective:  No chief complaint on file.   History of Present Illness: Ashley Adkins is a 39 y.o. female ***   Activities of Daily Living:  Patient reports morning stiffness for *** {minute/hour:19697}.   Patient {ACTIONS;DENIES/REPORTS:21021675::"Denies"} nocturnal pain.  Difficulty dressing/grooming: {ACTIONS;DENIES/REPORTS:21021675::"Denies"} Difficulty climbing stairs: {ACTIONS;DENIES/REPORTS:21021675::"Denies"} Difficulty getting out of chair: {ACTIONS;DENIES/REPORTS:21021675::"Denies"} Difficulty using hands for taps, buttons, cutlery, and/or writing: {ACTIONS;DENIES/REPORTS:21021675::"Denies"}  No Rheumatology ROS completed.   PMFS History:  Patient Active Problem List   Diagnosis Date Noted  . Chronic bilateral low back pain without sciatica 10/27/2018  . Pain in joint of left shoulder 03/18/2018  . Muscle tightness 01/12/2018  . Weakness of right foot 12/29/2014  . Right foot pain 12/29/2014  . Neuritis of right foot 12/29/2014    Past Medical History:  Diagnosis Date  . ANA positive   . Arthritis   . Colon polyps 2018  . History of chicken pox   . Hyperlipidemia    never on medication  . Vaginal delivery 2011, 2014    Family History  Problem Relation Age of Onset  . Asthma Mother   . Heart attack Maternal Grandmother   . Heart attack Maternal Grandfather   . Heart attack Paternal Grandfather    Past Surgical History:  Procedure Laterality Date  . HIP SURGERY Left   . HIP SURGERY Bilateral    Labrial tear, left 2007, right 2020 done arthroscopic   Social History   Social History Narrative   History of being a 2008   Two children   Immunization History  Administered Date(s) Administered  . PFIZER  SARS-COV-2 Vaccination 04/01/2019, 04/27/2019  . Tdap 06/29/2012     Objective: Vital Signs: There were no vitals taken for this visit.   Physical Exam   Musculoskeletal Exam: ***  CDAI Exam: CDAI Score: -- Patient Global: --; Provider Global: -- Swollen: --; Tender: -- Joint Exam 09/01/2019   No joint exam has been documented for this visit   There is currently no information documented on the homunculus. Go to the Rheumatology activity and complete the homunculus joint exam.  Investigation: No additional findings.  Imaging: No results found.  Recent Labs: Lab Results  Component Value Date   WBC 5.5 03/30/2019   HGB 12.3 03/30/2019   PLT 204.0 03/30/2019   NA 137 03/30/2019   K 4.4 03/30/2019   CL 102 03/30/2019   CO2 28 03/30/2019   GLUCOSE 81 03/30/2019   BUN 22 03/30/2019   CREATININE 0.79 03/30/2019   BILITOT 0.4 03/30/2019   ALKPHOS 56 03/30/2019   AST 22 03/30/2019   ALT 10 03/30/2019   PROT 6.6 03/30/2019   ALBUMIN 4.4 03/30/2019   CALCIUM 9.1 03/30/2019   GFRAA 93 12/29/2014    Speciality Comments: No specialty comments available.  Procedures:  No procedures performed Allergies: Latex   Assessment / Plan:     Visit Diagnoses: Pain in both hands  Positive ANA (antinuclear antibody) - 12/29/14: dsDNA 12, RNP-, Sm-, Ro-, La-, ANA+, RF-2017: repeat ANA negative   Neuritis of right foot  Pain in joint of left shoulder  Chronic bilateral low back pain  without sciatica  History of hyperlipidemia  Hx of colonic polyps  Orders: No orders of the defined types were placed in this encounter.  No orders of the defined types were placed in this encounter.   Face-to-face time spent with patient was *** minutes. Greater than 50% of time was spent in counseling and coordination of care.  Follow-Up Instructions: No follow-ups on file.   Gearldine Bienenstock, PA-C  Note - This record has been created using Dragon software.  Chart creation errors  have been sought, but may not always  have been located. Such creation errors do not reflect on  the standard of medical care.

## 2019-08-26 DIAGNOSIS — M9902 Segmental and somatic dysfunction of thoracic region: Secondary | ICD-10-CM | POA: Diagnosis not present

## 2019-08-26 DIAGNOSIS — M7071 Other bursitis of hip, right hip: Secondary | ICD-10-CM | POA: Diagnosis not present

## 2019-08-26 DIAGNOSIS — M9905 Segmental and somatic dysfunction of pelvic region: Secondary | ICD-10-CM | POA: Diagnosis not present

## 2019-08-26 DIAGNOSIS — M9903 Segmental and somatic dysfunction of lumbar region: Secondary | ICD-10-CM | POA: Diagnosis not present

## 2019-09-01 ENCOUNTER — Ambulatory Visit: Payer: BC Managed Care – PPO | Admitting: Rheumatology

## 2019-09-02 ENCOUNTER — Ambulatory Visit: Payer: BC Managed Care – PPO | Admitting: Rheumatology

## 2019-09-06 DIAGNOSIS — M9902 Segmental and somatic dysfunction of thoracic region: Secondary | ICD-10-CM | POA: Diagnosis not present

## 2019-09-06 DIAGNOSIS — M9903 Segmental and somatic dysfunction of lumbar region: Secondary | ICD-10-CM | POA: Diagnosis not present

## 2019-09-06 DIAGNOSIS — M9905 Segmental and somatic dysfunction of pelvic region: Secondary | ICD-10-CM | POA: Diagnosis not present

## 2019-09-06 DIAGNOSIS — M7071 Other bursitis of hip, right hip: Secondary | ICD-10-CM | POA: Diagnosis not present

## 2019-09-14 DIAGNOSIS — B079 Viral wart, unspecified: Secondary | ICD-10-CM | POA: Diagnosis not present

## 2019-09-20 DIAGNOSIS — M9903 Segmental and somatic dysfunction of lumbar region: Secondary | ICD-10-CM | POA: Diagnosis not present

## 2019-09-20 DIAGNOSIS — M7071 Other bursitis of hip, right hip: Secondary | ICD-10-CM | POA: Diagnosis not present

## 2019-09-20 DIAGNOSIS — M9902 Segmental and somatic dysfunction of thoracic region: Secondary | ICD-10-CM | POA: Diagnosis not present

## 2019-09-20 DIAGNOSIS — M9905 Segmental and somatic dysfunction of pelvic region: Secondary | ICD-10-CM | POA: Diagnosis not present

## 2019-09-30 ENCOUNTER — Other Ambulatory Visit: Payer: Self-pay | Admitting: Physician Assistant

## 2019-10-05 DIAGNOSIS — M9905 Segmental and somatic dysfunction of pelvic region: Secondary | ICD-10-CM | POA: Diagnosis not present

## 2019-10-05 DIAGNOSIS — M9902 Segmental and somatic dysfunction of thoracic region: Secondary | ICD-10-CM | POA: Diagnosis not present

## 2019-10-05 DIAGNOSIS — M9903 Segmental and somatic dysfunction of lumbar region: Secondary | ICD-10-CM | POA: Diagnosis not present

## 2019-10-05 DIAGNOSIS — M7071 Other bursitis of hip, right hip: Secondary | ICD-10-CM | POA: Diagnosis not present

## 2019-10-19 DIAGNOSIS — D485 Neoplasm of uncertain behavior of skin: Secondary | ICD-10-CM | POA: Diagnosis not present

## 2019-10-19 DIAGNOSIS — D235 Other benign neoplasm of skin of trunk: Secondary | ICD-10-CM | POA: Diagnosis not present

## 2019-10-21 DIAGNOSIS — Z6820 Body mass index (BMI) 20.0-20.9, adult: Secondary | ICD-10-CM | POA: Diagnosis not present

## 2019-10-21 DIAGNOSIS — Z1231 Encounter for screening mammogram for malignant neoplasm of breast: Secondary | ICD-10-CM | POA: Diagnosis not present

## 2019-10-21 DIAGNOSIS — Z1389 Encounter for screening for other disorder: Secondary | ICD-10-CM | POA: Diagnosis not present

## 2019-10-21 DIAGNOSIS — Z1151 Encounter for screening for human papillomavirus (HPV): Secondary | ICD-10-CM | POA: Diagnosis not present

## 2019-10-21 DIAGNOSIS — Z8639 Personal history of other endocrine, nutritional and metabolic disease: Secondary | ICD-10-CM | POA: Diagnosis not present

## 2019-10-21 DIAGNOSIS — Z01419 Encounter for gynecological examination (general) (routine) without abnormal findings: Secondary | ICD-10-CM | POA: Diagnosis not present

## 2019-10-21 DIAGNOSIS — Z124 Encounter for screening for malignant neoplasm of cervix: Secondary | ICD-10-CM | POA: Diagnosis not present

## 2019-10-21 LAB — COMPREHENSIVE METABOLIC PANEL
Albumin: 4.6 (ref 3.5–5.0)
Calcium: 9.2 (ref 8.7–10.7)
GFR calc Af Amer: 78
GFR calc non Af Amer: 67
Globulin: 2.1

## 2019-10-21 LAB — LIPID PANEL
Cholesterol: 291 — AB (ref 0–200)
HDL: 112 — AB (ref 35–70)
LDL Cholesterol: 169
LDl/HDL Ratio: 1.5
Triglycerides: 65 (ref 40–160)

## 2019-10-21 LAB — CBC AND DIFFERENTIAL
HCT: 39 (ref 36–46)
Hemoglobin: 12.4 (ref 12.0–16.0)
Neutrophils Absolute: 5.1
Platelets: 228 (ref 150–399)
WBC: 6.9

## 2019-10-21 LAB — HM MAMMOGRAPHY

## 2019-10-21 LAB — HEPATIC FUNCTION PANEL
ALT: 13 (ref 7–35)
AST: 30 (ref 13–35)
Alkaline Phosphatase: 50 (ref 25–125)
Bilirubin, Total: 0.2

## 2019-10-21 LAB — BASIC METABOLIC PANEL
BUN: 18 (ref 4–21)
CO2: 24 — AB (ref 13–22)
Chloride: 101 (ref 99–108)
Creatinine: 1 (ref 0.5–1.1)
Glucose: 88
Potassium: 4.4 (ref 3.4–5.3)
Sodium: 138 (ref 137–147)

## 2019-10-21 LAB — CBC: RBC: 4.5 (ref 3.87–5.11)

## 2019-10-26 LAB — HM PAP SMEAR: HM Pap smear: NEGATIVE

## 2019-10-31 ENCOUNTER — Encounter: Payer: Self-pay | Admitting: Physician Assistant

## 2019-11-22 DIAGNOSIS — M25551 Pain in right hip: Secondary | ICD-10-CM | POA: Diagnosis not present

## 2019-11-22 DIAGNOSIS — M25512 Pain in left shoulder: Secondary | ICD-10-CM | POA: Diagnosis not present

## 2019-12-13 DIAGNOSIS — J301 Allergic rhinitis due to pollen: Secondary | ICD-10-CM | POA: Diagnosis not present

## 2019-12-13 DIAGNOSIS — R059 Cough, unspecified: Secondary | ICD-10-CM | POA: Diagnosis not present

## 2019-12-13 DIAGNOSIS — H1045 Other chronic allergic conjunctivitis: Secondary | ICD-10-CM | POA: Diagnosis not present

## 2019-12-13 DIAGNOSIS — J3089 Other allergic rhinitis: Secondary | ICD-10-CM | POA: Diagnosis not present

## 2019-12-21 DIAGNOSIS — M7130 Other bursal cyst, unspecified site: Secondary | ICD-10-CM | POA: Diagnosis not present

## 2019-12-21 DIAGNOSIS — L821 Other seborrheic keratosis: Secondary | ICD-10-CM | POA: Diagnosis not present

## 2019-12-21 DIAGNOSIS — B078 Other viral warts: Secondary | ICD-10-CM | POA: Diagnosis not present

## 2019-12-21 DIAGNOSIS — L905 Scar conditions and fibrosis of skin: Secondary | ICD-10-CM | POA: Diagnosis not present

## 2019-12-23 ENCOUNTER — Other Ambulatory Visit: Payer: Self-pay | Admitting: Physician Assistant

## 2020-01-04 ENCOUNTER — Encounter: Payer: Self-pay | Admitting: Physician Assistant

## 2020-01-04 DIAGNOSIS — M25552 Pain in left hip: Secondary | ICD-10-CM | POA: Diagnosis not present

## 2020-01-12 ENCOUNTER — Other Ambulatory Visit: Payer: Self-pay | Admitting: Family Medicine

## 2020-01-20 ENCOUNTER — Telehealth (INDEPENDENT_AMBULATORY_CARE_PROVIDER_SITE_OTHER): Payer: BC Managed Care – PPO | Admitting: Family Medicine

## 2020-01-20 ENCOUNTER — Encounter: Payer: Self-pay | Admitting: Family Medicine

## 2020-01-20 VITALS — Ht 66.0 in | Wt 128.0 lb

## 2020-01-20 DIAGNOSIS — R059 Cough, unspecified: Secondary | ICD-10-CM

## 2020-01-20 MED ORDER — METHYLPREDNISOLONE 4 MG PO TBPK: ORAL_TABLET | ORAL | 0 refills | Status: DC

## 2020-01-20 MED ORDER — BENZONATATE 200 MG PO CAPS: 200.0000 mg | ORAL_CAPSULE | Freq: Two times a day (BID) | ORAL | 0 refills | Status: DC | PRN

## 2020-01-20 NOTE — Progress Notes (Signed)
   Ashley Adkins is a 40 y.o. female who presents today for a virtual office visit.  Assessment/Plan:  New/Acute Problems: Cough No red flags.  Likely postinfectious cough.  Discussed limitations of virtual visit and inability to perform physical exam.  Will start Tessalon.  She also requested prescription for steroids.  We will send in Oh asked her to wait a few days before starting.  Discuss potential side effects.  Can continue using over-the-counter meds as needed.  Discussed reasons to return to care.    Subjective:  HPI:  Patient with lingering cough for the past several weeks.  She thinks that she had COVID about 3 weeks ago.  She did not have a positive test however her husband tested positive for COVID.  She had symptoms at that time including fever cough and congestion.  Symptoms have largely resolved with exception of cough.  Seems to be keeping her up at night.  Has tried several over-the-counter medications with no improvement.        Objective/Observations  Physical Exam: Gen: NAD, resting comfortably= Pulm: Normal work of breathing Neuro: Grossly normal, moves all extremities Psych: Normal affect and thought content  Virtual Visit via Video   I connected with Ashley Adkins on 01/20/20 at  3:40 PM EST by a video enabled telemedicine application and verified that I am speaking with the correct person using two identifiers. The limitations of evaluation and management by telemedicine and the availability of in person appointments were discussed. The patient expressed understanding and agreed to proceed.   Patient location: Home Provider location: [place] Persons participating in the virtual visit: Myself and Patient     Katina Degree. Jimmey Ralph, MD 01/20/2020 3:37 PM

## 2020-01-26 ENCOUNTER — Other Ambulatory Visit: Payer: Self-pay | Admitting: Physical Medicine and Rehabilitation

## 2020-01-26 DIAGNOSIS — M25552 Pain in left hip: Secondary | ICD-10-CM

## 2020-01-31 DIAGNOSIS — L409 Psoriasis, unspecified: Secondary | ICD-10-CM | POA: Diagnosis not present

## 2020-01-31 DIAGNOSIS — R760 Raised antibody titer: Secondary | ICD-10-CM | POA: Diagnosis not present

## 2020-01-31 DIAGNOSIS — M2550 Pain in unspecified joint: Secondary | ICD-10-CM | POA: Diagnosis not present

## 2020-02-02 DIAGNOSIS — L723 Sebaceous cyst: Secondary | ICD-10-CM | POA: Diagnosis not present

## 2020-02-02 DIAGNOSIS — D2261 Melanocytic nevi of right upper limb, including shoulder: Secondary | ICD-10-CM | POA: Diagnosis not present

## 2020-02-02 DIAGNOSIS — B353 Tinea pedis: Secondary | ICD-10-CM | POA: Diagnosis not present

## 2020-02-02 DIAGNOSIS — L309 Dermatitis, unspecified: Secondary | ICD-10-CM | POA: Diagnosis not present

## 2020-02-02 DIAGNOSIS — B078 Other viral warts: Secondary | ICD-10-CM | POA: Diagnosis not present

## 2020-02-16 DIAGNOSIS — H40012 Open angle with borderline findings, low risk, left eye: Secondary | ICD-10-CM | POA: Diagnosis not present

## 2020-02-29 ENCOUNTER — Other Ambulatory Visit: Payer: Self-pay

## 2020-02-29 ENCOUNTER — Ambulatory Visit
Admission: RE | Admit: 2020-02-29 | Discharge: 2020-02-29 | Disposition: A | Discharge status: Home / Self Care | Payer: BC Managed Care – PPO | Source: Ambulatory Visit | Attending: Physical Medicine and Rehabilitation | Admitting: Physical Medicine and Rehabilitation

## 2020-02-29 DIAGNOSIS — M25552 Pain in left hip: Secondary | ICD-10-CM

## 2020-02-29 MED ORDER — IOPAMIDOL (ISOVUE-M 200) INJECTION 41%
13.0000 mL | Freq: Once | INTRAMUSCULAR | Status: AC
  Administered 2020-02-29: 13 mL via INTRA_ARTICULAR

## 2020-03-07 DIAGNOSIS — M25552 Pain in left hip: Secondary | ICD-10-CM | POA: Diagnosis not present

## 2020-03-07 DIAGNOSIS — M25562 Pain in left knee: Secondary | ICD-10-CM | POA: Diagnosis not present

## 2020-03-07 DIAGNOSIS — G8929 Other chronic pain: Secondary | ICD-10-CM | POA: Diagnosis not present

## 2020-03-09 DIAGNOSIS — M7071 Other bursitis of hip, right hip: Secondary | ICD-10-CM | POA: Diagnosis not present

## 2020-03-09 DIAGNOSIS — M9902 Segmental and somatic dysfunction of thoracic region: Secondary | ICD-10-CM | POA: Diagnosis not present

## 2020-03-09 DIAGNOSIS — M9903 Segmental and somatic dysfunction of lumbar region: Secondary | ICD-10-CM | POA: Diagnosis not present

## 2020-03-09 DIAGNOSIS — M9905 Segmental and somatic dysfunction of pelvic region: Secondary | ICD-10-CM | POA: Diagnosis not present

## 2020-03-13 DIAGNOSIS — M7071 Other bursitis of hip, right hip: Secondary | ICD-10-CM | POA: Diagnosis not present

## 2020-03-13 DIAGNOSIS — M9905 Segmental and somatic dysfunction of pelvic region: Secondary | ICD-10-CM | POA: Diagnosis not present

## 2020-03-13 DIAGNOSIS — M9902 Segmental and somatic dysfunction of thoracic region: Secondary | ICD-10-CM | POA: Diagnosis not present

## 2020-03-13 DIAGNOSIS — M9903 Segmental and somatic dysfunction of lumbar region: Secondary | ICD-10-CM | POA: Diagnosis not present

## 2020-03-15 DIAGNOSIS — M9903 Segmental and somatic dysfunction of lumbar region: Secondary | ICD-10-CM | POA: Diagnosis not present

## 2020-03-15 DIAGNOSIS — M7071 Other bursitis of hip, right hip: Secondary | ICD-10-CM | POA: Diagnosis not present

## 2020-03-15 DIAGNOSIS — M9902 Segmental and somatic dysfunction of thoracic region: Secondary | ICD-10-CM | POA: Diagnosis not present

## 2020-03-15 DIAGNOSIS — M9905 Segmental and somatic dysfunction of pelvic region: Secondary | ICD-10-CM | POA: Diagnosis not present

## 2020-03-20 ENCOUNTER — Other Ambulatory Visit: Payer: Self-pay | Admitting: Physician Assistant

## 2020-03-22 DIAGNOSIS — M9902 Segmental and somatic dysfunction of thoracic region: Secondary | ICD-10-CM | POA: Diagnosis not present

## 2020-03-22 DIAGNOSIS — M7071 Other bursitis of hip, right hip: Secondary | ICD-10-CM | POA: Diagnosis not present

## 2020-03-22 DIAGNOSIS — M9903 Segmental and somatic dysfunction of lumbar region: Secondary | ICD-10-CM | POA: Diagnosis not present

## 2020-03-22 DIAGNOSIS — M9905 Segmental and somatic dysfunction of pelvic region: Secondary | ICD-10-CM | POA: Diagnosis not present

## 2020-04-04 ENCOUNTER — Other Ambulatory Visit: Payer: Self-pay

## 2020-04-04 ENCOUNTER — Ambulatory Visit (INDEPENDENT_AMBULATORY_CARE_PROVIDER_SITE_OTHER): Payer: BC Managed Care – PPO | Admitting: Physician Assistant

## 2020-04-04 ENCOUNTER — Encounter: Payer: Self-pay | Admitting: Physician Assistant

## 2020-04-04 VITALS — BP 120/76 | HR 69 | Temp 97.9°F | Ht 66.0 in | Wt 131.6 lb

## 2020-04-04 DIAGNOSIS — Z01818 Encounter for other preprocedural examination: Secondary | ICD-10-CM | POA: Diagnosis not present

## 2020-04-04 DIAGNOSIS — J302 Other seasonal allergic rhinitis: Secondary | ICD-10-CM | POA: Diagnosis not present

## 2020-04-04 MED ORDER — BENZONATATE 200 MG PO CAPS: 200.0000 mg | ORAL_CAPSULE | Freq: Two times a day (BID) | ORAL | 3 refills | Status: DC | PRN

## 2020-04-04 MED ORDER — FLUTICASONE PROPIONATE 50 MCG/ACT NA SUSP: 2.0000 | Freq: Every day | NASAL | 2 refills | Status: DC

## 2020-04-04 NOTE — Progress Notes (Signed)
Ashley Adkins is a 40 y.o. female here for a new problem.  History of Present Illness:   Chief Complaint  Patient presents with  . Medical Clearance    Having surgery.  . Sinus Problem    Post nasal drip, tessalon pearls do help. May need refilled. Wants opinion.    HPI  Medical clearance She is having L hip arthroscopy on 05/03/20 with Dr. Caswell Corwin. This will be her 3rd hip surgery. She has had surgery with Dr. Caswell Corwin in the past. Is planning to do PT but is undecided where she will have this done. She is currently exercising regularly doing 20 mile bicycle rides. Denies any prior issues with anesthesia or pain management. Does not get SOB or CP with any exertional activity. Denies hx of clotting/bleeding issues.   Allergies Patient reports that she is having seasonal allergies. She has had some nasal congestion. Has been told in the past that she would be eligible for allergy shots but reports that she does not have time to pursue this. Has started claritin daily. Has done flonase in the past but not regularly. Symptoms include: nasal congestion and ear fullness. Gets PND that causes tickle in throat that she is using tessalon perles for, this works well for her.   Past Medical History:  Diagnosis Date  . ANA positive   . Arthritis   . Colon polyps 2018  . History of chicken pox   . Hyperlipidemia    never on medication  . Vaginal delivery 2011, 2014     Social History   Tobacco Use  . Smoking status: Never Smoker  . Smokeless tobacco: Never Used  Vaping Use  . Vaping Use: Never used  Substance Use Topics  . Alcohol use: No    Alcohol/week: 0.0 standard drinks  . Drug use: No    Past Surgical History:  Procedure Laterality Date  . HIP SURGERY Left   . HIP SURGERY Bilateral    Labrial tear, left 2007, right 2020 done arthroscopic    Family History  Problem Relation Age of Onset  . Asthma Mother   . Heart attack Maternal Grandmother   . Heart attack Maternal  Grandfather   . Heart attack Paternal Grandfather     Allergies  Allergen Reactions  . Latex Rash    Current Medications:   Current Outpatient Medications:  .  Cholecalciferol (VITAMIN D3) 125 MCG (5000 UT) CAPS, Take 1 capsule by mouth daily., Disp: , Rfl:  .  diazepam (VALIUM) 5 MG tablet, Take 1 tablet (5 mg total) by mouth at bedtime as needed for anxiety., Disp: 20 tablet, Rfl: 1 .  FLUoxetine (PROZAC) 10 MG capsule, TAKE 1 CAPSULE BY MOUTH EVERY DAY, Disp: 90 capsule, Rfl: 0 .  fluticasone (FLONASE) 50 MCG/ACT nasal spray, Place 2 sprays into both nostrils daily., Disp: 16 g, Rfl: 2 .  ketoconazole (NIZORAL) 2 % cream, SMARTSIG:1 Application Topical 1 to 2 Times Daily, Disp: , Rfl:  .  loratadine (CLARITIN) 10 MG tablet, Take 10 mg by mouth daily as needed for allergies., Disp: , Rfl:  .  MELATONIN GUMMIES PO, Take 3 mg by mouth at bedtime., Disp: , Rfl:  .  Misc Natural Products (GLUCOSAMINE CHOND MSM FORMULA PO), Take 1 capsule by mouth daily. 1000 mg, 1200 mg, 1000 mg, Disp: , Rfl:  .  Multiple Vitamin (MULTIVITAMIN) tablet, Take 1 tablet by mouth daily., Disp: , Rfl:  .  Probiotic Product (PROBIOTIC DAILY PO), Take by mouth daily. 85  BILLION, Disp: , Rfl:  .  VITAMIN K PO, Take 45 mcg by mouth daily., Disp: , Rfl:  .  benzonatate (TESSALON) 200 MG capsule, Take 1 capsule (200 mg total) by mouth 2 (two) times daily as needed for cough., Disp: 30 capsule, Rfl: 3 .  busPIRone (BUSPAR) 5 MG tablet, Take 1 tablet (5 mg total) by mouth 3 (three) times daily as needed. (Patient not taking: Reported on 04/04/2020), Disp: 30 tablet, Rfl: 1 .  clindamycin (CLEOCIN T) 1 % external solution, , Disp: , Rfl:  .  clindamycin (CLINDAGEL) 1 % gel, Apply BID to toes/feet. Apply 1st (Patient not taking: Reported on 04/04/2020), Disp: , Rfl:  .  fluocinonide ointment (LIDEX) 0.05 %, Apply topically 2 (two) times daily. to affected area (Patient not taking: Reported on 04/04/2020), Disp: , Rfl:  .   gabapentin (NEURONTIN) 100 MG capsule, TAKE 1 CAPSULE BY MOUTH EVERY DAY AT BEDTIME , MAY INCREASE TO 3 TIMES A DAY IF NEEDED (Patient not taking: Reported on 04/04/2020), Disp: 90 capsule, Rfl: 3 .  methylPREDNISolone (MEDROL DOSEPAK) 4 MG TBPK tablet, 6-5-4-3-2-1-OFF (Patient not taking: Reported on 04/04/2020), Disp: 21 tablet, Rfl: 0   Review of Systems:   ROS Negative unless otherwise specified per HPI.  Vitals:   Vitals:   04/04/20 1435  BP: 120/76  Pulse: 69  Temp: 97.9 F (36.6 C)  TempSrc: Temporal  SpO2: 98%  Weight: 131 lb 9.6 oz (59.7 kg)  Height: 5\' 6"  (1.676 m)     Body mass index is 21.24 kg/m.  Physical Exam:   Physical Exam Vitals and nursing note reviewed.  Constitutional:      General: She is not in acute distress.    Appearance: She is well-developed. She is not ill-appearing or toxic-appearing.  HENT:     Head: Normocephalic and atraumatic.     Right Ear: Ear canal and external ear normal. A middle ear effusion is present. Tympanic membrane is not erythematous, retracted or bulging.     Left Ear: Ear canal and external ear normal. A middle ear effusion is present. Tympanic membrane is not erythematous, retracted or bulging.     Nose: Nose normal.     Right Sinus: No maxillary sinus tenderness or frontal sinus tenderness.     Left Sinus: No maxillary sinus tenderness or frontal sinus tenderness.     Mouth/Throat:     Pharynx: Uvula midline. No posterior oropharyngeal erythema.  Eyes:     General: Lids are normal.     Conjunctiva/sclera: Conjunctivae normal.  Neck:     Trachea: Trachea normal.  Cardiovascular:     Rate and Rhythm: Normal rate and regular rhythm.     Pulses: Normal pulses.     Heart sounds: Normal heart sounds, S1 normal and S2 normal.     Comments: No LE edema Pulmonary:     Effort: Pulmonary effort is normal.     Breath sounds: Normal breath sounds. No decreased breath sounds, wheezing, rhonchi or rales.  Lymphadenopathy:      Cervical: No cervical adenopathy.  Skin:    General: Skin is warm and dry.  Neurological:     Mental Status: She is alert.     GCS: GCS eye subscore is 4. GCS verbal subscore is 5. GCS motor subscore is 6.  Psychiatric:        Speech: Speech normal.        Behavior: Behavior normal. Behavior is cooperative.      Assessment and  Plan:   Lular was seen today for medical clearance and sinus problem.  Diagnoses and all orders for this visit:  Preoperative clearance No red flags on discussion. It is my opinion that she is a low risk surgical candidate. As long as blood work is WNL, she is cleared from surgery from my perspective. -     CBC with Differential/Platelet -     Comprehensive metabolic panel  Seasonal allergies No evidence of infection. Continue claritin. Start nasal saline spray BID followed by flonase. Refill of tessalon. Follow-up as needed.  Other orders -     fluticasone (FLONASE) 50 MCG/ACT nasal spray; Place 2 sprays into both nostrils daily. -     benzonatate (TESSALON) 200 MG capsule; Take 1 capsule (200 mg total) by mouth 2 (two) times daily as needed for cough.  Time spent with patient today was 25 minutes which consisted of chart review, discussing diagnosis, work up, treatment answering questions and documentation.   Jarold Motto, PA-C

## 2020-04-04 NOTE — Patient Instructions (Signed)
It was great  to see you!  Update blood work today. We will fax letter to the surgeon.  Continue over the counter antihistamines such as Zyrtec (cetirizine), Claritin (loratadine), Allegra (fexofenadine), or Xyzal (levocetirizine) daily as needed. May take twice a day if needed as long as it does not cause drowsiness.  Nasal saline spray (i.e., Simply Saline) or nasal saline lavage (i.e., NeilMed) is recommended as needed and prior to medicated nasal sprays.  Continue Flonase 1 spray twice a day.                Take care,                             Jarold Motto PA-C

## 2020-04-05 ENCOUNTER — Encounter: Payer: Self-pay | Admitting: Physician Assistant

## 2020-04-05 DIAGNOSIS — M9903 Segmental and somatic dysfunction of lumbar region: Secondary | ICD-10-CM | POA: Diagnosis not present

## 2020-04-05 DIAGNOSIS — M9902 Segmental and somatic dysfunction of thoracic region: Secondary | ICD-10-CM | POA: Diagnosis not present

## 2020-04-05 DIAGNOSIS — M9905 Segmental and somatic dysfunction of pelvic region: Secondary | ICD-10-CM | POA: Diagnosis not present

## 2020-04-05 DIAGNOSIS — M7071 Other bursitis of hip, right hip: Secondary | ICD-10-CM | POA: Diagnosis not present

## 2020-04-05 LAB — CBC WITH DIFFERENTIAL/PLATELET
Basophils Absolute: 0.1 10*3/uL (ref 0.0–0.1)
Basophils Relative: 1.2 % (ref 0.0–3.0)
Eosinophils Absolute: 0 10*3/uL (ref 0.0–0.7)
Eosinophils Relative: 0.3 % (ref 0.0–5.0)
HCT: 35.8 % — ABNORMAL LOW (ref 36.0–46.0)
Hemoglobin: 12 g/dL (ref 12.0–15.0)
Lymphocytes Relative: 15 % (ref 12.0–46.0)
Lymphs Abs: 0.9 10*3/uL (ref 0.7–4.0)
MCHC: 33.5 g/dL (ref 30.0–36.0)
MCV: 85.5 fl (ref 78.0–100.0)
Monocytes Absolute: 0.4 10*3/uL (ref 0.1–1.0)
Monocytes Relative: 6.7 % (ref 3.0–12.0)
Neutro Abs: 4.7 10*3/uL (ref 1.4–7.7)
Neutrophils Relative %: 76.8 % (ref 43.0–77.0)
Platelets: 201 10*3/uL (ref 150.0–400.0)
RBC: 4.19 Mil/uL (ref 3.87–5.11)
RDW: 14.2 % (ref 11.5–15.5)
WBC: 6.1 10*3/uL (ref 4.0–10.5)

## 2020-04-05 LAB — COMPREHENSIVE METABOLIC PANEL
ALT: 10 U/L (ref 0–35)
AST: 26 U/L (ref 0–37)
Albumin: 4.3 g/dL (ref 3.5–5.2)
Alkaline Phosphatase: 50 U/L (ref 39–117)
BUN: 23 mg/dL (ref 6–23)
CO2: 28 mEq/L (ref 19–32)
Calcium: 9.2 mg/dL (ref 8.4–10.5)
Chloride: 104 mEq/L (ref 96–112)
Creatinine, Ser: 0.88 mg/dL (ref 0.40–1.20)
GFR: 81.66 mL/min (ref >60.00)
Glucose, Bld: 70 mg/dL (ref 70–99)
Potassium: 4.5 mEq/L (ref 3.5–5.1)
Sodium: 139 mEq/L (ref 135–145)
Total Bilirubin: 0.3 mg/dL (ref 0.2–1.2)
Total Protein: 6.7 g/dL (ref 6.0–8.3)

## 2020-05-02 DIAGNOSIS — M25552 Pain in left hip: Secondary | ICD-10-CM | POA: Diagnosis not present

## 2020-05-02 DIAGNOSIS — R262 Difficulty in walking, not elsewhere classified: Secondary | ICD-10-CM | POA: Diagnosis not present

## 2020-05-03 DIAGNOSIS — S73191A Other sprain of right hip, initial encounter: Secondary | ICD-10-CM | POA: Diagnosis not present

## 2020-05-03 DIAGNOSIS — M6588 Other synovitis and tenosynovitis, other site: Secondary | ICD-10-CM | POA: Diagnosis not present

## 2020-05-03 DIAGNOSIS — M25552 Pain in left hip: Secondary | ICD-10-CM | POA: Diagnosis not present

## 2020-05-03 DIAGNOSIS — S73192A Other sprain of left hip, initial encounter: Secondary | ICD-10-CM | POA: Diagnosis not present

## 2020-05-03 DIAGNOSIS — M25851 Other specified joint disorders, right hip: Secondary | ICD-10-CM | POA: Diagnosis not present

## 2020-05-03 DIAGNOSIS — M94251 Chondromalacia, right hip: Secondary | ICD-10-CM | POA: Diagnosis not present

## 2020-05-03 DIAGNOSIS — M94252 Chondromalacia, left hip: Secondary | ICD-10-CM | POA: Diagnosis not present

## 2020-05-03 DIAGNOSIS — M25852 Other specified joint disorders, left hip: Secondary | ICD-10-CM | POA: Diagnosis not present

## 2020-05-03 DIAGNOSIS — Z9889 Other specified postprocedural states: Secondary | ICD-10-CM | POA: Diagnosis not present

## 2020-05-03 DIAGNOSIS — M65851 Other synovitis and tenosynovitis, right thigh: Secondary | ICD-10-CM | POA: Diagnosis not present

## 2020-05-03 DIAGNOSIS — X58XXXA Exposure to other specified factors, initial encounter: Secondary | ICD-10-CM | POA: Diagnosis not present

## 2020-05-04 DIAGNOSIS — M65852 Other synovitis and tenosynovitis, left thigh: Secondary | ICD-10-CM | POA: Diagnosis not present

## 2020-05-04 DIAGNOSIS — M25552 Pain in left hip: Secondary | ICD-10-CM | POA: Diagnosis not present

## 2020-05-04 DIAGNOSIS — M25852 Other specified joint disorders, left hip: Secondary | ICD-10-CM | POA: Diagnosis not present

## 2020-05-08 DIAGNOSIS — M25552 Pain in left hip: Secondary | ICD-10-CM | POA: Diagnosis not present

## 2020-05-10 DIAGNOSIS — M25552 Pain in left hip: Secondary | ICD-10-CM | POA: Diagnosis not present

## 2020-05-12 DIAGNOSIS — M25552 Pain in left hip: Secondary | ICD-10-CM | POA: Diagnosis not present

## 2020-05-15 DIAGNOSIS — M25552 Pain in left hip: Secondary | ICD-10-CM | POA: Diagnosis not present

## 2020-05-17 DIAGNOSIS — M25552 Pain in left hip: Secondary | ICD-10-CM | POA: Diagnosis not present

## 2020-05-19 DIAGNOSIS — M25552 Pain in left hip: Secondary | ICD-10-CM | POA: Diagnosis not present

## 2020-05-22 DIAGNOSIS — M25552 Pain in left hip: Secondary | ICD-10-CM | POA: Diagnosis not present

## 2020-05-24 DIAGNOSIS — M25552 Pain in left hip: Secondary | ICD-10-CM | POA: Diagnosis not present

## 2020-05-31 DIAGNOSIS — M25552 Pain in left hip: Secondary | ICD-10-CM | POA: Diagnosis not present

## 2020-06-02 DIAGNOSIS — M25552 Pain in left hip: Secondary | ICD-10-CM | POA: Diagnosis not present

## 2020-06-06 DIAGNOSIS — M25552 Pain in left hip: Secondary | ICD-10-CM | POA: Diagnosis not present

## 2020-06-12 DIAGNOSIS — M25552 Pain in left hip: Secondary | ICD-10-CM | POA: Diagnosis not present

## 2020-06-13 DIAGNOSIS — M25552 Pain in left hip: Secondary | ICD-10-CM | POA: Diagnosis not present

## 2020-06-15 DIAGNOSIS — L918 Other hypertrophic disorders of the skin: Secondary | ICD-10-CM | POA: Diagnosis not present

## 2020-06-16 DIAGNOSIS — L918 Other hypertrophic disorders of the skin: Secondary | ICD-10-CM | POA: Diagnosis not present

## 2020-06-16 DIAGNOSIS — L919 Hypertrophic disorder of the skin, unspecified: Secondary | ICD-10-CM | POA: Diagnosis not present

## 2020-06-20 ENCOUNTER — Other Ambulatory Visit: Payer: Self-pay | Admitting: Physician Assistant

## 2020-06-20 DIAGNOSIS — M25552 Pain in left hip: Secondary | ICD-10-CM | POA: Diagnosis not present

## 2020-06-21 ENCOUNTER — Encounter: Payer: Self-pay | Admitting: Physician Assistant

## 2020-06-22 DIAGNOSIS — R29898 Other symptoms and signs involving the musculoskeletal system: Secondary | ICD-10-CM | POA: Diagnosis not present

## 2020-06-22 DIAGNOSIS — M25652 Stiffness of left hip, not elsewhere classified: Secondary | ICD-10-CM | POA: Diagnosis not present

## 2020-06-22 DIAGNOSIS — Z9889 Other specified postprocedural states: Secondary | ICD-10-CM | POA: Diagnosis not present

## 2020-06-22 DIAGNOSIS — Z7409 Other reduced mobility: Secondary | ICD-10-CM | POA: Diagnosis not present

## 2020-06-22 DIAGNOSIS — S73192S Other sprain of left hip, sequela: Secondary | ICD-10-CM | POA: Diagnosis not present

## 2020-06-28 DIAGNOSIS — Z7409 Other reduced mobility: Secondary | ICD-10-CM | POA: Diagnosis not present

## 2020-06-28 DIAGNOSIS — M25652 Stiffness of left hip, not elsewhere classified: Secondary | ICD-10-CM | POA: Diagnosis not present

## 2020-06-28 DIAGNOSIS — Z9889 Other specified postprocedural states: Secondary | ICD-10-CM | POA: Diagnosis not present

## 2020-06-28 DIAGNOSIS — R29898 Other symptoms and signs involving the musculoskeletal system: Secondary | ICD-10-CM | POA: Diagnosis not present

## 2020-07-06 ENCOUNTER — Other Ambulatory Visit: Payer: Self-pay | Admitting: Physician Assistant

## 2020-07-06 DIAGNOSIS — M25652 Stiffness of left hip, not elsewhere classified: Secondary | ICD-10-CM | POA: Diagnosis not present

## 2020-07-06 DIAGNOSIS — S73192S Other sprain of left hip, sequela: Secondary | ICD-10-CM | POA: Diagnosis not present

## 2020-07-06 DIAGNOSIS — R29898 Other symptoms and signs involving the musculoskeletal system: Secondary | ICD-10-CM | POA: Diagnosis not present

## 2020-07-06 DIAGNOSIS — Z7409 Other reduced mobility: Secondary | ICD-10-CM | POA: Diagnosis not present

## 2020-07-06 DIAGNOSIS — Z9889 Other specified postprocedural states: Secondary | ICD-10-CM | POA: Diagnosis not present

## 2020-07-17 ENCOUNTER — Ambulatory Visit (INDEPENDENT_AMBULATORY_CARE_PROVIDER_SITE_OTHER): Payer: BC Managed Care – PPO | Admitting: Family

## 2020-07-17 ENCOUNTER — Encounter: Payer: Self-pay | Admitting: Family

## 2020-07-17 ENCOUNTER — Other Ambulatory Visit: Payer: Self-pay

## 2020-07-17 VITALS — BP 110/60 | HR 62 | Temp 98.2°F | Ht 66.0 in | Wt 128.4 lb

## 2020-07-17 DIAGNOSIS — H6983 Other specified disorders of Eustachian tube, bilateral: Secondary | ICD-10-CM | POA: Diagnosis not present

## 2020-07-17 NOTE — Patient Instructions (Signed)
Flonase 2 sprays in each nostril once a day Allegra or Claritin once daily

## 2020-07-19 DIAGNOSIS — R29898 Other symptoms and signs involving the musculoskeletal system: Secondary | ICD-10-CM | POA: Diagnosis not present

## 2020-07-19 DIAGNOSIS — M25652 Stiffness of left hip, not elsewhere classified: Secondary | ICD-10-CM | POA: Diagnosis not present

## 2020-07-19 DIAGNOSIS — Z7409 Other reduced mobility: Secondary | ICD-10-CM | POA: Diagnosis not present

## 2020-07-19 DIAGNOSIS — Z9889 Other specified postprocedural states: Secondary | ICD-10-CM | POA: Diagnosis not present

## 2020-07-19 NOTE — Progress Notes (Signed)
Acute Office Visit  Subjective:    Patient ID: Ashley Adkins, female    DOB: [birth year], 40 y.o.   MRN: [id number]  Chief Complaint  Patient presents with   Ear Fullness    Pt says that she recently had a cold 1 week ago.    HPI Patient is in today with c/o feelings of hoarseness, cough, congestion that is better. Has been taking a decongestant. She is a Counselling psychologist. Feels like her left ear is clogged and this is now her primary concern.  Past Medical History:  Diagnosis Date   ANA positive    Arthritis    Colon polyps 2018   History of chicken pox    Hyperlipidemia    never on medication   Vaginal delivery 2011, 2014    Past Surgical History:  Procedure Laterality Date   HIP SURGERY Left    HIP SURGERY Bilateral    Labrial tear, left 2007, right 2020 done arthroscopic    Family History  Problem Relation Age of Onset   Asthma Mother    Heart attack Maternal Grandmother    Heart attack Maternal Grandfather    Heart attack Paternal Grandfather     Social History   Socioeconomic History   Marital status: Married    Spouse name: Mat Carne   Number of children: 1   Years of education: 16   Highest education level: Not on file  Occupational History   Occupation: stay at home mom  Tobacco Use   Smoking status: Never   Smokeless tobacco: Never  Vaping Use   Vaping Use: Never used  Substance and Sexual Activity   Alcohol use: No    Alcohol/week: 0.0 standard drinks   Drug use: No   Sexual activity: Yes    Birth control/protection: None  Other Topics Concern   Not on file  Social History Narrative   History of being a Personal assistant   Two children   Social Determinants of Health   Financial Resource Strain: Not on file  Food Insecurity: Not on file  Transportation Needs: Not on file  Physical Activity: Not on file  Stress: Not on file  Social Connections: Not on file  Intimate Partner Violence: Not on file    Outpatient Medications Prior to Visit   Medication Sig Dispense Refill   benzonatate (TESSALON) 200 MG capsule Take 1 capsule (200 mg total) by mouth 2 (two) times daily as needed for cough. 30 capsule 3   Cholecalciferol (VITAMIN D3) 125 MCG (5000 UT) CAPS Take 1 capsule by mouth daily.     diazepam (VALIUM) 5 MG tablet Take 1 tablet (5 mg total) by mouth at bedtime as needed for anxiety. 20 tablet 1   FLUoxetine (PROZAC) 10 MG capsule TAKE 1 CAPSULE BY MOUTH EVERY DAY 90 capsule 0   fluticasone (FLONASE) 50 MCG/ACT nasal spray SPRAY 2 SPRAYS INTO EACH NOSTRIL EVERY DAY 48 mL 2   ketoconazole (NIZORAL) 2 % cream SMARTSIG:1 Application Topical 1 to 2 Times Daily     loratadine (CLARITIN) 10 MG tablet Take 10 mg by mouth daily as needed for allergies.     MELATONIN GUMMIES PO Take 3 mg by mouth at bedtime.     Misc Natural Products (GLUCOSAMINE CHOND MSM FORMULA PO) Take 1 capsule by mouth daily. 1000 mg, 1200 mg, 1000 mg     Multiple Vitamin (MULTIVITAMIN) tablet Take 1 tablet by mouth daily.     Probiotic Product (PROBIOTIC DAILY PO) Take by  mouth daily. 85 BILLION     VITAMIN K PO Take 45 mcg by mouth daily.     busPIRone (BUSPAR) 5 MG tablet Take 1 tablet (5 mg total) by mouth 3 (three) times daily as needed. (Patient not taking: No sig reported) 30 tablet 1   clindamycin (CLEOCIN T) 1 % external solution  (Patient not taking: No sig reported)     clindamycin (CLINDAGEL) 1 % gel Apply BID to toes/feet. Apply 1st (Patient not taking: No sig reported)     fluocinonide ointment (LIDEX) 0.05 % Apply topically 2 (two) times daily. to affected area (Patient not taking: No sig reported)     gabapentin (NEURONTIN) 100 MG capsule TAKE 1 CAPSULE BY MOUTH EVERY DAY AT BEDTIME , MAY INCREASE TO 3 TIMES A DAY IF NEEDED (Patient not taking: No sig reported) 90 capsule 3   methylPREDNISolone (MEDROL DOSEPAK) 4 MG TBPK tablet 6-5-4-3-2-1-OFF (Patient not taking: No sig reported) 21 tablet 0   No facility-administered medications prior to visit.     Allergies  Allergen Reactions   Latex Rash    Review of Systems  Constitutional: Negative.   HENT:  Positive for congestion and postnasal drip. Negative for ear pain.        Ear fullness  Respiratory: Negative.  Negative for cough and wheezing.   Gastrointestinal: Negative.   Endocrine: Negative.   Musculoskeletal: Negative.   Skin: Negative.   Allergic/Immunologic: Negative.   Neurological: Negative.   Psychiatric/Behavioral: Negative.    All other systems reviewed and are negative.     Objective:    Physical Exam Vitals reviewed.  Constitutional:      Appearance: Normal appearance.  HENT:     Head: Normocephalic and atraumatic.     Right Ear: Tympanic membrane and ear canal normal.     Left Ear: Tympanic membrane and ear canal normal.     Ears:     Comments: Fluid noted to the left TM Cardiovascular:     Rate and Rhythm: Normal rate and regular rhythm.  Pulmonary:     Effort: Pulmonary effort is normal.     Breath sounds: Normal breath sounds.  Musculoskeletal:        General: Normal range of motion.     Cervical back: Normal range of motion and neck supple.  Skin:    General: Skin is warm and dry.  Neurological:     General: No focal deficit present.     Mental Status: She is alert and oriented to person, place, and time.  Psychiatric:        Mood and Affect: Mood normal.        Behavior: Behavior normal.    BP 110/60   Pulse 62   Temp 98.2 F (36.8 C) (Temporal)   Ht 5\' 6"  (1.676 m)   Wt 128 lb 6.4 oz (58.2 kg)   SpO2 99%   BMI 20.72 kg/m  Wt Readings from Last 3 Encounters:  07/17/20 128 lb 6.4 oz (58.2 kg)  04/04/20 131 lb 9.6 oz (59.7 kg)  01/20/20 128 lb (58.1 kg)    Health Maintenance Due  Topic Date Due   Pneumococcal Vaccine 48-7 Years old (1 - PCV) Never done   Hepatitis C Screening  Never done    There are no preventive care reminders to display for this patient.   Lab Results  Component Value Date   TSH 1.030 12/29/2014    Lab Results  Component Value Date   WBC 6.1  04/04/2020   HGB 12.0 04/04/2020   HCT 35.8 (L) 04/04/2020   MCV 85.5 04/04/2020   PLT 201.0 04/04/2020   Lab Results  Component Value Date   NA 139 04/04/2020   K 4.5 04/04/2020   CO2 28 04/04/2020   GLUCOSE 70 04/04/2020   BUN 23 04/04/2020   CREATININE 0.88 04/04/2020   BILITOT 0.3 04/04/2020   ALKPHOS 50 04/04/2020   AST 26 04/04/2020   ALT 10 04/04/2020   PROT 6.7 04/04/2020   ALBUMIN 4.3 04/04/2020   CALCIUM 9.2 04/04/2020   GFR 81.66 04/04/2020   Lab Results  Component Value Date   CHOL 291 (A) 10/21/2019   Lab Results  Component Value Date   HDL 112 (A) 10/21/2019   Lab Results  Component Value Date   LDLCALC 169 10/21/2019   Lab Results  Component Value Date   TRIG 65 10/21/2019   Lab Results  Component Value Date   CHOLHDL 2 03/30/2019   Lab Results  Component Value Date   HGBA1C 5.6 12/29/2014       Assessment & Plan:   Problem List Items Addressed This Visit   None Visit Diagnoses     Dysfunction of both eustachian tubes    -  Primary       Plan: Flonase 2 sprays in each nostril once a day. Antihistamine as needed. Call the office with any questions or concerns.   No orders of the defined types were placed in this encounter.    Eulis Foster, FNP

## 2020-07-26 DIAGNOSIS — M9905 Segmental and somatic dysfunction of pelvic region: Secondary | ICD-10-CM | POA: Diagnosis not present

## 2020-07-26 DIAGNOSIS — M9902 Segmental and somatic dysfunction of thoracic region: Secondary | ICD-10-CM | POA: Diagnosis not present

## 2020-07-26 DIAGNOSIS — M9903 Segmental and somatic dysfunction of lumbar region: Secondary | ICD-10-CM | POA: Diagnosis not present

## 2020-07-26 DIAGNOSIS — M7071 Other bursitis of hip, right hip: Secondary | ICD-10-CM | POA: Diagnosis not present

## 2020-08-01 DIAGNOSIS — Z7409 Other reduced mobility: Secondary | ICD-10-CM | POA: Diagnosis not present

## 2020-08-16 DIAGNOSIS — R29898 Other symptoms and signs involving the musculoskeletal system: Secondary | ICD-10-CM | POA: Diagnosis not present

## 2020-08-16 DIAGNOSIS — Z7409 Other reduced mobility: Secondary | ICD-10-CM | POA: Diagnosis not present

## 2020-08-16 DIAGNOSIS — Z9889 Other specified postprocedural states: Secondary | ICD-10-CM | POA: Diagnosis not present

## 2020-08-16 DIAGNOSIS — M25652 Stiffness of left hip, not elsewhere classified: Secondary | ICD-10-CM | POA: Diagnosis not present

## 2020-08-22 DIAGNOSIS — M25551 Pain in right hip: Secondary | ICD-10-CM | POA: Diagnosis not present

## 2020-08-22 DIAGNOSIS — Z9889 Other specified postprocedural states: Secondary | ICD-10-CM | POA: Diagnosis not present

## 2020-08-22 DIAGNOSIS — M25552 Pain in left hip: Secondary | ICD-10-CM | POA: Diagnosis not present

## 2020-08-31 DIAGNOSIS — M25652 Stiffness of left hip, not elsewhere classified: Secondary | ICD-10-CM | POA: Diagnosis not present

## 2020-08-31 DIAGNOSIS — R29898 Other symptoms and signs involving the musculoskeletal system: Secondary | ICD-10-CM | POA: Diagnosis not present

## 2020-08-31 DIAGNOSIS — Z9889 Other specified postprocedural states: Secondary | ICD-10-CM | POA: Diagnosis not present

## 2020-08-31 DIAGNOSIS — S73192S Other sprain of left hip, sequela: Secondary | ICD-10-CM | POA: Diagnosis not present

## 2020-08-31 DIAGNOSIS — Z7409 Other reduced mobility: Secondary | ICD-10-CM | POA: Diagnosis not present

## 2020-09-06 DIAGNOSIS — M545 Low back pain, unspecified: Secondary | ICD-10-CM | POA: Diagnosis not present

## 2020-09-06 DIAGNOSIS — D485 Neoplasm of uncertain behavior of skin: Secondary | ICD-10-CM | POA: Diagnosis not present

## 2020-09-06 DIAGNOSIS — L920 Granuloma annulare: Secondary | ICD-10-CM | POA: Diagnosis not present

## 2020-09-06 DIAGNOSIS — M25562 Pain in left knee: Secondary | ICD-10-CM | POA: Diagnosis not present

## 2020-09-06 DIAGNOSIS — B353 Tinea pedis: Secondary | ICD-10-CM | POA: Diagnosis not present

## 2020-09-06 DIAGNOSIS — L570 Actinic keratosis: Secondary | ICD-10-CM | POA: Diagnosis not present

## 2020-09-08 ENCOUNTER — Other Ambulatory Visit: Payer: Self-pay | Admitting: Physical Medicine and Rehabilitation

## 2020-09-08 DIAGNOSIS — M545 Low back pain, unspecified: Secondary | ICD-10-CM

## 2020-09-08 DIAGNOSIS — G8929 Other chronic pain: Secondary | ICD-10-CM

## 2020-09-08 DIAGNOSIS — M25562 Pain in left knee: Secondary | ICD-10-CM

## 2020-09-12 DIAGNOSIS — M9903 Segmental and somatic dysfunction of lumbar region: Secondary | ICD-10-CM | POA: Diagnosis not present

## 2020-09-12 DIAGNOSIS — M9905 Segmental and somatic dysfunction of pelvic region: Secondary | ICD-10-CM | POA: Diagnosis not present

## 2020-09-12 DIAGNOSIS — M7071 Other bursitis of hip, right hip: Secondary | ICD-10-CM | POA: Diagnosis not present

## 2020-09-12 DIAGNOSIS — M9902 Segmental and somatic dysfunction of thoracic region: Secondary | ICD-10-CM | POA: Diagnosis not present

## 2020-09-15 DIAGNOSIS — R29898 Other symptoms and signs involving the musculoskeletal system: Secondary | ICD-10-CM | POA: Diagnosis not present

## 2020-09-15 DIAGNOSIS — Z9889 Other specified postprocedural states: Secondary | ICD-10-CM | POA: Diagnosis not present

## 2020-09-15 DIAGNOSIS — M25652 Stiffness of left hip, not elsewhere classified: Secondary | ICD-10-CM | POA: Diagnosis not present

## 2020-09-15 DIAGNOSIS — Z7409 Other reduced mobility: Secondary | ICD-10-CM | POA: Diagnosis not present

## 2020-09-15 DIAGNOSIS — M5136 Other intervertebral disc degeneration, lumbar region: Secondary | ICD-10-CM | POA: Diagnosis not present

## 2020-09-18 ENCOUNTER — Other Ambulatory Visit: Payer: Self-pay | Admitting: Family Medicine

## 2020-09-19 DIAGNOSIS — F411 Generalized anxiety disorder: Secondary | ICD-10-CM | POA: Diagnosis not present

## 2020-09-20 DIAGNOSIS — M7071 Other bursitis of hip, right hip: Secondary | ICD-10-CM | POA: Diagnosis not present

## 2020-09-20 DIAGNOSIS — M9902 Segmental and somatic dysfunction of thoracic region: Secondary | ICD-10-CM | POA: Diagnosis not present

## 2020-09-20 DIAGNOSIS — M9905 Segmental and somatic dysfunction of pelvic region: Secondary | ICD-10-CM | POA: Diagnosis not present

## 2020-09-20 DIAGNOSIS — M9903 Segmental and somatic dysfunction of lumbar region: Secondary | ICD-10-CM | POA: Diagnosis not present

## 2020-09-24 ENCOUNTER — Ambulatory Visit
Admission: RE | Admit: 2020-09-24 | Discharge: 2020-09-24 | Disposition: A | Discharge status: Home / Self Care | Payer: BC Managed Care – PPO | Source: Ambulatory Visit | Attending: Physical Medicine and Rehabilitation | Admitting: Physical Medicine and Rehabilitation

## 2020-09-24 DIAGNOSIS — M25562 Pain in left knee: Secondary | ICD-10-CM

## 2020-09-24 DIAGNOSIS — G8929 Other chronic pain: Secondary | ICD-10-CM

## 2020-09-24 DIAGNOSIS — M545 Low back pain, unspecified: Secondary | ICD-10-CM

## 2020-09-24 DIAGNOSIS — M1712 Unilateral primary osteoarthritis, left knee: Secondary | ICD-10-CM | POA: Diagnosis not present

## 2020-09-26 DIAGNOSIS — M5459 Other low back pain: Secondary | ICD-10-CM | POA: Diagnosis not present

## 2020-09-26 DIAGNOSIS — R531 Weakness: Secondary | ICD-10-CM | POA: Diagnosis not present

## 2020-09-28 DIAGNOSIS — M9905 Segmental and somatic dysfunction of pelvic region: Secondary | ICD-10-CM | POA: Diagnosis not present

## 2020-09-28 DIAGNOSIS — M9902 Segmental and somatic dysfunction of thoracic region: Secondary | ICD-10-CM | POA: Diagnosis not present

## 2020-09-28 DIAGNOSIS — M9903 Segmental and somatic dysfunction of lumbar region: Secondary | ICD-10-CM | POA: Diagnosis not present

## 2020-09-28 DIAGNOSIS — M7071 Other bursitis of hip, right hip: Secondary | ICD-10-CM | POA: Diagnosis not present

## 2020-09-29 DIAGNOSIS — R531 Weakness: Secondary | ICD-10-CM | POA: Diagnosis not present

## 2020-09-29 DIAGNOSIS — M5459 Other low back pain: Secondary | ICD-10-CM | POA: Diagnosis not present

## 2020-10-02 DIAGNOSIS — M9905 Segmental and somatic dysfunction of pelvic region: Secondary | ICD-10-CM | POA: Diagnosis not present

## 2020-10-02 DIAGNOSIS — M5136 Other intervertebral disc degeneration, lumbar region: Secondary | ICD-10-CM | POA: Diagnosis not present

## 2020-10-02 DIAGNOSIS — M9902 Segmental and somatic dysfunction of thoracic region: Secondary | ICD-10-CM | POA: Diagnosis not present

## 2020-10-02 DIAGNOSIS — M9903 Segmental and somatic dysfunction of lumbar region: Secondary | ICD-10-CM | POA: Diagnosis not present

## 2020-10-02 DIAGNOSIS — M47816 Spondylosis without myelopathy or radiculopathy, lumbar region: Secondary | ICD-10-CM | POA: Diagnosis not present

## 2020-10-02 DIAGNOSIS — M25562 Pain in left knee: Secondary | ICD-10-CM | POA: Diagnosis not present

## 2020-10-02 DIAGNOSIS — M7071 Other bursitis of hip, right hip: Secondary | ICD-10-CM | POA: Diagnosis not present

## 2020-10-03 DIAGNOSIS — R531 Weakness: Secondary | ICD-10-CM | POA: Diagnosis not present

## 2020-10-03 DIAGNOSIS — M5459 Other low back pain: Secondary | ICD-10-CM | POA: Diagnosis not present

## 2020-10-05 DIAGNOSIS — R531 Weakness: Secondary | ICD-10-CM | POA: Diagnosis not present

## 2020-10-05 DIAGNOSIS — M5459 Other low back pain: Secondary | ICD-10-CM | POA: Diagnosis not present

## 2020-10-06 DIAGNOSIS — M9903 Segmental and somatic dysfunction of lumbar region: Secondary | ICD-10-CM | POA: Diagnosis not present

## 2020-10-06 DIAGNOSIS — M9905 Segmental and somatic dysfunction of pelvic region: Secondary | ICD-10-CM | POA: Diagnosis not present

## 2020-10-06 DIAGNOSIS — M7071 Other bursitis of hip, right hip: Secondary | ICD-10-CM | POA: Diagnosis not present

## 2020-10-06 DIAGNOSIS — M9902 Segmental and somatic dysfunction of thoracic region: Secondary | ICD-10-CM | POA: Diagnosis not present

## 2020-10-09 DIAGNOSIS — R531 Weakness: Secondary | ICD-10-CM | POA: Diagnosis not present

## 2020-10-09 DIAGNOSIS — M5459 Other low back pain: Secondary | ICD-10-CM | POA: Diagnosis not present

## 2020-10-12 DIAGNOSIS — M9905 Segmental and somatic dysfunction of pelvic region: Secondary | ICD-10-CM | POA: Diagnosis not present

## 2020-10-12 DIAGNOSIS — M9903 Segmental and somatic dysfunction of lumbar region: Secondary | ICD-10-CM | POA: Diagnosis not present

## 2020-10-12 DIAGNOSIS — M9902 Segmental and somatic dysfunction of thoracic region: Secondary | ICD-10-CM | POA: Diagnosis not present

## 2020-10-12 DIAGNOSIS — M7071 Other bursitis of hip, right hip: Secondary | ICD-10-CM | POA: Diagnosis not present

## 2020-10-13 DIAGNOSIS — M5459 Other low back pain: Secondary | ICD-10-CM | POA: Diagnosis not present

## 2020-10-13 DIAGNOSIS — R531 Weakness: Secondary | ICD-10-CM | POA: Diagnosis not present

## 2020-10-16 DIAGNOSIS — M5459 Other low back pain: Secondary | ICD-10-CM | POA: Diagnosis not present

## 2020-10-16 DIAGNOSIS — R531 Weakness: Secondary | ICD-10-CM | POA: Diagnosis not present

## 2020-10-17 DIAGNOSIS — F411 Generalized anxiety disorder: Secondary | ICD-10-CM | POA: Diagnosis not present

## 2020-10-19 DIAGNOSIS — M5459 Other low back pain: Secondary | ICD-10-CM | POA: Diagnosis not present

## 2020-10-19 DIAGNOSIS — R531 Weakness: Secondary | ICD-10-CM | POA: Diagnosis not present

## 2020-10-23 DIAGNOSIS — M5459 Other low back pain: Secondary | ICD-10-CM | POA: Diagnosis not present

## 2020-10-23 DIAGNOSIS — R531 Weakness: Secondary | ICD-10-CM | POA: Diagnosis not present

## 2020-10-24 DIAGNOSIS — M9905 Segmental and somatic dysfunction of pelvic region: Secondary | ICD-10-CM | POA: Diagnosis not present

## 2020-10-24 DIAGNOSIS — M7071 Other bursitis of hip, right hip: Secondary | ICD-10-CM | POA: Diagnosis not present

## 2020-10-24 DIAGNOSIS — M9902 Segmental and somatic dysfunction of thoracic region: Secondary | ICD-10-CM | POA: Diagnosis not present

## 2020-10-24 DIAGNOSIS — M9903 Segmental and somatic dysfunction of lumbar region: Secondary | ICD-10-CM | POA: Diagnosis not present

## 2020-10-27 DIAGNOSIS — R531 Weakness: Secondary | ICD-10-CM | POA: Diagnosis not present

## 2020-10-27 DIAGNOSIS — M5459 Other low back pain: Secondary | ICD-10-CM | POA: Diagnosis not present

## 2020-10-31 DIAGNOSIS — M9902 Segmental and somatic dysfunction of thoracic region: Secondary | ICD-10-CM | POA: Diagnosis not present

## 2020-10-31 DIAGNOSIS — M9905 Segmental and somatic dysfunction of pelvic region: Secondary | ICD-10-CM | POA: Diagnosis not present

## 2020-10-31 DIAGNOSIS — M7071 Other bursitis of hip, right hip: Secondary | ICD-10-CM | POA: Diagnosis not present

## 2020-10-31 DIAGNOSIS — M9903 Segmental and somatic dysfunction of lumbar region: Secondary | ICD-10-CM | POA: Diagnosis not present

## 2020-11-03 DIAGNOSIS — R531 Weakness: Secondary | ICD-10-CM | POA: Diagnosis not present

## 2020-11-03 DIAGNOSIS — M5459 Other low back pain: Secondary | ICD-10-CM | POA: Diagnosis not present

## 2020-11-10 DIAGNOSIS — M5459 Other low back pain: Secondary | ICD-10-CM | POA: Diagnosis not present

## 2020-11-10 DIAGNOSIS — R531 Weakness: Secondary | ICD-10-CM | POA: Diagnosis not present

## 2020-11-13 DIAGNOSIS — Z01411 Encounter for gynecological examination (general) (routine) with abnormal findings: Secondary | ICD-10-CM | POA: Diagnosis not present

## 2020-11-13 DIAGNOSIS — Z6820 Body mass index (BMI) 20.0-20.9, adult: Secondary | ICD-10-CM | POA: Diagnosis not present

## 2020-11-13 DIAGNOSIS — Z1151 Encounter for screening for human papillomavirus (HPV): Secondary | ICD-10-CM | POA: Diagnosis not present

## 2020-11-13 DIAGNOSIS — Z124 Encounter for screening for malignant neoplasm of cervix: Secondary | ICD-10-CM | POA: Diagnosis not present

## 2020-11-13 DIAGNOSIS — Z130 Encounter for screening for diseases of the blood and blood-forming organs and certain disorders involving the immune mechanism: Secondary | ICD-10-CM | POA: Diagnosis not present

## 2020-11-14 DIAGNOSIS — Z124 Encounter for screening for malignant neoplasm of cervix: Secondary | ICD-10-CM | POA: Diagnosis not present

## 2020-11-14 DIAGNOSIS — Z1151 Encounter for screening for human papillomavirus (HPV): Secondary | ICD-10-CM | POA: Diagnosis not present

## 2020-11-14 LAB — RESULTS CONSOLE HPV: CHL HPV: NEGATIVE

## 2020-11-14 LAB — HM PAP SMEAR

## 2020-11-17 DIAGNOSIS — M9902 Segmental and somatic dysfunction of thoracic region: Secondary | ICD-10-CM | POA: Diagnosis not present

## 2020-11-17 DIAGNOSIS — M9905 Segmental and somatic dysfunction of pelvic region: Secondary | ICD-10-CM | POA: Diagnosis not present

## 2020-11-17 DIAGNOSIS — M9903 Segmental and somatic dysfunction of lumbar region: Secondary | ICD-10-CM | POA: Diagnosis not present

## 2020-11-17 DIAGNOSIS — M7071 Other bursitis of hip, right hip: Secondary | ICD-10-CM | POA: Diagnosis not present

## 2020-11-21 DIAGNOSIS — Z4789 Encounter for other orthopedic aftercare: Secondary | ICD-10-CM | POA: Diagnosis not present

## 2020-11-27 DIAGNOSIS — M7071 Other bursitis of hip, right hip: Secondary | ICD-10-CM | POA: Diagnosis not present

## 2020-11-27 DIAGNOSIS — M9902 Segmental and somatic dysfunction of thoracic region: Secondary | ICD-10-CM | POA: Diagnosis not present

## 2020-11-27 DIAGNOSIS — M9903 Segmental and somatic dysfunction of lumbar region: Secondary | ICD-10-CM | POA: Diagnosis not present

## 2020-11-27 DIAGNOSIS — M9905 Segmental and somatic dysfunction of pelvic region: Secondary | ICD-10-CM | POA: Diagnosis not present

## 2020-12-04 DIAGNOSIS — M9905 Segmental and somatic dysfunction of pelvic region: Secondary | ICD-10-CM | POA: Diagnosis not present

## 2020-12-04 DIAGNOSIS — M25552 Pain in left hip: Secondary | ICD-10-CM | POA: Diagnosis not present

## 2020-12-04 DIAGNOSIS — R269 Unspecified abnormalities of gait and mobility: Secondary | ICD-10-CM | POA: Diagnosis not present

## 2020-12-06 DIAGNOSIS — M9903 Segmental and somatic dysfunction of lumbar region: Secondary | ICD-10-CM | POA: Diagnosis not present

## 2020-12-06 DIAGNOSIS — M9905 Segmental and somatic dysfunction of pelvic region: Secondary | ICD-10-CM | POA: Diagnosis not present

## 2020-12-06 DIAGNOSIS — M7071 Other bursitis of hip, right hip: Secondary | ICD-10-CM | POA: Diagnosis not present

## 2020-12-06 DIAGNOSIS — M9902 Segmental and somatic dysfunction of thoracic region: Secondary | ICD-10-CM | POA: Diagnosis not present

## 2020-12-13 DIAGNOSIS — M9903 Segmental and somatic dysfunction of lumbar region: Secondary | ICD-10-CM | POA: Diagnosis not present

## 2020-12-13 DIAGNOSIS — M7071 Other bursitis of hip, right hip: Secondary | ICD-10-CM | POA: Diagnosis not present

## 2020-12-13 DIAGNOSIS — M9905 Segmental and somatic dysfunction of pelvic region: Secondary | ICD-10-CM | POA: Diagnosis not present

## 2020-12-13 DIAGNOSIS — M9902 Segmental and somatic dysfunction of thoracic region: Secondary | ICD-10-CM | POA: Diagnosis not present

## 2020-12-15 ENCOUNTER — Other Ambulatory Visit: Payer: Self-pay | Admitting: Physician Assistant

## 2020-12-18 DIAGNOSIS — Z1231 Encounter for screening mammogram for malignant neoplasm of breast: Secondary | ICD-10-CM | POA: Diagnosis not present

## 2020-12-18 LAB — HM MAMMOGRAPHY

## 2020-12-18 NOTE — Telephone Encounter (Signed)
Pt requesting refills. Last seen 07/2020 by Worthy Rancher.

## 2020-12-20 DIAGNOSIS — M7071 Other bursitis of hip, right hip: Secondary | ICD-10-CM | POA: Diagnosis not present

## 2020-12-20 DIAGNOSIS — M9902 Segmental and somatic dysfunction of thoracic region: Secondary | ICD-10-CM | POA: Diagnosis not present

## 2020-12-20 DIAGNOSIS — M9903 Segmental and somatic dysfunction of lumbar region: Secondary | ICD-10-CM | POA: Diagnosis not present

## 2020-12-20 DIAGNOSIS — M9905 Segmental and somatic dysfunction of pelvic region: Secondary | ICD-10-CM | POA: Diagnosis not present

## 2020-12-22 DIAGNOSIS — M25552 Pain in left hip: Secondary | ICD-10-CM | POA: Diagnosis not present

## 2020-12-25 DIAGNOSIS — M25562 Pain in left knee: Secondary | ICD-10-CM | POA: Diagnosis not present

## 2020-12-28 DIAGNOSIS — L570 Actinic keratosis: Secondary | ICD-10-CM | POA: Diagnosis not present

## 2020-12-28 DIAGNOSIS — Z23 Encounter for immunization: Secondary | ICD-10-CM | POA: Diagnosis not present

## 2020-12-28 DIAGNOSIS — L0291 Cutaneous abscess, unspecified: Secondary | ICD-10-CM | POA: Diagnosis not present

## 2021-03-07 ENCOUNTER — Other Ambulatory Visit: Payer: Self-pay | Admitting: Physician Assistant

## 2021-03-07 ENCOUNTER — Encounter: Payer: Self-pay | Admitting: Physician Assistant

## 2021-03-07 ENCOUNTER — Other Ambulatory Visit: Payer: Self-pay

## 2021-03-07 ENCOUNTER — Ambulatory Visit (INDEPENDENT_AMBULATORY_CARE_PROVIDER_SITE_OTHER): Payer: 59 | Admitting: Physician Assistant

## 2021-03-07 VITALS — BP 102/64 | HR 55 | Temp 98.2°F | Ht 66.0 in | Wt 128.0 lb

## 2021-03-07 DIAGNOSIS — Z0001 Encounter for general adult medical examination with abnormal findings: Secondary | ICD-10-CM

## 2021-03-07 DIAGNOSIS — J302 Other seasonal allergic rhinitis: Secondary | ICD-10-CM | POA: Diagnosis not present

## 2021-03-07 DIAGNOSIS — R768 Other specified abnormal immunological findings in serum: Secondary | ICD-10-CM

## 2021-03-07 DIAGNOSIS — M25559 Pain in unspecified hip: Secondary | ICD-10-CM

## 2021-03-07 DIAGNOSIS — Z136 Encounter for screening for cardiovascular disorders: Secondary | ICD-10-CM

## 2021-03-07 DIAGNOSIS — Z13220 Encounter for screening for lipoid disorders: Secondary | ICD-10-CM | POA: Diagnosis not present

## 2021-03-07 DIAGNOSIS — E785 Hyperlipidemia, unspecified: Secondary | ICD-10-CM

## 2021-03-07 DIAGNOSIS — F419 Anxiety disorder, unspecified: Secondary | ICD-10-CM

## 2021-03-07 DIAGNOSIS — R7689 Other specified abnormal immunological findings in serum: Secondary | ICD-10-CM

## 2021-03-07 LAB — LIPID PANEL
Cholesterol: 249 mg/dL — ABNORMAL HIGH (ref 0–200)
HDL: 81.5 mg/dL (ref >39.00)
LDL Cholesterol: 159 mg/dL — ABNORMAL HIGH (ref 0–99)
NonHDL: 167.69
Total CHOL/HDL Ratio: 3
Triglycerides: 42 mg/dL (ref 0.0–149.0)
VLDL: 8.4 mg/dL (ref 0.0–40.0)

## 2021-03-07 LAB — COMPREHENSIVE METABOLIC PANEL
ALT: 8 U/L (ref 0–35)
AST: 19 U/L (ref 0–37)
Albumin: 4.1 g/dL (ref 3.5–5.2)
Alkaline Phosphatase: 44 U/L (ref 39–117)
BUN: 18 mg/dL (ref 6–23)
CO2: 27 mEq/L (ref 19–32)
Calcium: 9.1 mg/dL (ref 8.4–10.5)
Chloride: 102 mEq/L (ref 96–112)
Creatinine, Ser: 0.86 mg/dL (ref 0.40–1.20)
GFR: 83.4 mL/min (ref >60.00)
Glucose, Bld: 88 mg/dL (ref 70–99)
Potassium: 4.3 mEq/L (ref 3.5–5.1)
Sodium: 135 mEq/L (ref 135–145)
Total Bilirubin: 0.4 mg/dL (ref 0.2–1.2)
Total Protein: 6.9 g/dL (ref 6.0–8.3)

## 2021-03-07 LAB — CBC WITH DIFFERENTIAL/PLATELET
Basophils Absolute: 0 10*3/uL (ref 0.0–0.1)
Basophils Relative: 1 % (ref 0.0–3.0)
Eosinophils Absolute: 0 10*3/uL (ref 0.0–0.7)
Eosinophils Relative: 1 % (ref 0.0–5.0)
HCT: 38.2 % (ref 36.0–46.0)
Hemoglobin: 12.5 g/dL (ref 12.0–15.0)
Lymphocytes Relative: 25 % (ref 12.0–46.0)
Lymphs Abs: 1.1 10*3/uL (ref 0.7–4.0)
MCHC: 32.8 g/dL (ref 30.0–36.0)
MCV: 84 fl (ref 78.0–100.0)
Monocytes Absolute: 0.4 10*3/uL (ref 0.1–1.0)
Monocytes Relative: 8.3 % (ref 3.0–12.0)
Neutro Abs: 3 10*3/uL (ref 1.4–7.7)
Neutrophils Relative %: 64.7 % (ref 43.0–77.0)
Platelets: 218 10*3/uL (ref 150.0–400.0)
RBC: 4.54 Mil/uL (ref 3.87–5.11)
RDW: 12.8 % (ref 11.5–15.5)
WBC: 4.6 10*3/uL (ref 4.0–10.5)

## 2021-03-07 MED ORDER — AZELASTINE HCL 0.1 % NA SOLN
1.0000 | Freq: Two times a day (BID) | NASAL | 12 refills | Status: DC
Start: 2021-03-07 — End: 2021-09-07

## 2021-03-07 NOTE — Patient Instructions (Addendum)
It was great to see you! ? ?For your ear pressure ?---Nasal saline spray (i.e., Simply Saline) or nasal saline lavage (i.e., NeilMed) is recommended as needed and prior to medicated nasal sprays. ?---Trial astelin nasal spray in place of flonase. Use once spray in AM and PM. ? ?Consider calcium scan -- review materials and let me know after we get your blood work back. ? ?See written rx for naltrexone. ? ?Please go to the lab for blood work.  ? ?Our office will call you with your results unless you have chosen to receive results via MyChart. ? ?If your blood work is normal we will follow-up each year for physicals and as scheduled for chronic medical problems. ? ?If anything is abnormal we will treat accordingly and get you in for a follow-up. ? ?Take care, ? ?[name] ?  ?

## 2021-03-07 NOTE — Progress Notes (Signed)
Subjective:    Ashley Adkins is a 41 y.o. female and is here for a comprehensive physical exam.  HPI  Health Maintenance Due  Topic Date Due   Hepatitis C Screening  Never done   COVID-19 Vaccine (6 - Booster) 02/29/2020   MAMMOGRAM  10/20/2020    Acute Concerns: Allergies Blanka currently c/o ear fullness that has been onset for a couple of days that she attributes to being caused by allergies. States that she has followed up with an allergist who found her to be allergic to grass and trees, but declined going through with allergy injections. Since declining further intervention she has been compliant with taking claritin 10 mg daily but is trying to find a solution for her nasal congestion. According to pt, she has tried using flonase nasal spray but found she had nosebleeds daily while using the medication. At this time she is wondering what other nasal sprays she could use to remedy this. Denies concerning sx.   Chronic Issues: Anxiety Tationna is currently compliant with taking prozac 10 mg daily with no adverse effects. Pt expresses that this medication has been beneficial to them. States she feels overall calmer and is able to sleep with no issues. At this time she is tolerating well. Denies SI/HI.   Hip Pain On 05/03/20, pt underwent a left arthroscopy revision which turned out to be her 3rd hip surgery. Although the surgery was completed by Dr. Caswell Corwin with no complications, her left hip stiffness has not been completely resolved. Following her surgery she did complete PT with Claudia Pollock with slight improvement and has now transitioned to seeing Geronimo Boot. At this time she is receiving PRP injections which she finds to be beneficial, but due to this has not been allowed to exercise.   Alera reports that she has seen a homeopathic provider and was prescribed naltrexone 0.5 mg daily. Currently she is taking 0.2 mg daily but is working her way up to 4.5 mg daily. At this  time she finds this to be extremely beneficial in terms of her hip pain following her injections and sleep. She is managing well at this time.    Bilateral Hand Pain  Pt has been regularly following up with Dr. Dierdre Forth, rheumatology about this issue. She does admit that she is still experiencing bilateral hand pain that she finds to worsen in the winter, but states she is able to manage. Upon further discussion she did express that one of her friends did some research and found her pain could be caused by her adrenal glands. Due to this she is interested in having this checked in some way if possible. Denies worsening pain.     HLD Pt has never been on medication for this issue, instead has lived a very healthy lifestyle when it comes to eating and exercise. After further discussion, she expressed interest in completing a calcium score due to fhx of HLD. Denies CP or SOB.    Health Maintenance: Immunizations -- Covid- UTD Influenza- Declined Tdap- UTD;2014 Colonoscopy -- UTD;2018 Mammogram -- Due;2021 PAP -- Due; 2020 Bone Density -- N/A Ophthalmology- UTD Dentistry- UTD Diet -- Eats all food groups Sleep habits -- No concerns Exercise -- Not able at this time; see above Weight -- Stable  Mood -- Stable Weight history: Wt Readings from Last 10 Encounters:  03/07/21 128 lb (58.1 kg)  07/17/20 128 lb 6.4 oz (58.2 kg)  04/04/20 131 lb 9.6 oz (59.7 kg)  01/20/20 128 lb (  58.1 kg)  03/30/19 128 lb 6.1 oz (58.2 kg)  11/17/18 129 lb (58.5 kg)  10/27/18 130 lb (59 kg)  01/12/18 126 lb 9.6 oz (57.4 kg)  12/25/17 125 lb 12.8 oz (57.1 kg)  12/08/17 124 lb 9.6 oz (56.5 kg)   Body mass index is 20.66 kg/m. Patient's last menstrual period was 02/21/2021 (approximate). Alcohol use:  reports no history of alcohol use. Tobacco use:  Tobacco Use: Low Risk    Smoking Tobacco Use: Never   Smokeless Tobacco Use: Never   Passive Exposure: Not on file     Depression screen [facility] 2/9 03/07/2021   Decreased Interest 0  Down, Depressed, Hopeless 0  PHQ - 2 Score 0     Other providers/specialists: Patient Care Team: Jarold Motto, [place] as PCP - General (Physician Assistant) Andrena Mews, DO as Consulting Physician (Sports Medicine) [facility], PT as Physical Therapist (Physical Therapy) [facility], [facility] as Consulting Physician (Obstetrics and Gynecology)    PMHx, SurgHx, SocialHx, Medications, and Allergies were reviewed in the Visit Navigator and updated as appropriate.   Past Medical History:  Diagnosis Date   ANA positive    Arthritis    Colon polyps 2018   History of chicken pox    Hyperlipidemia    never on medication   Vaginal delivery 2011, 2014     Past Surgical History:  Procedure Laterality Date   HIP SURGERY Left    HIP SURGERY Bilateral    Labrial tear, left 2007, right 2020 done arthroscopic     Family History  Problem Relation Age of Onset   Asthma Mother    Heart attack Maternal Grandmother    Heart attack Maternal Grandfather    Heart attack Paternal Grandfather     Social History   Tobacco Use   Smoking status: Never   Smokeless tobacco: Never  Vaping Use   Vaping Use: Never used  Substance Use Topics   Alcohol use: No    Alcohol/week: 0.0 standard drinks   Drug use: No    Review of Systems:   Review of Systems  Constitutional:  Negative for chills, fever, malaise/fatigue and weight loss.  HENT:  Negative for hearing loss, sinus pain and sore throat.   Respiratory:  Negative for cough and hemoptysis.   Cardiovascular:  Negative for chest pain, palpitations, leg swelling and PND.  Gastrointestinal:  Negative for abdominal pain, constipation, diarrhea, heartburn, nausea and vomiting.  Genitourinary:  Negative for dysuria, frequency and urgency.  Musculoskeletal:  Negative for back pain, myalgias and neck pain.  Skin:  Negative for itching and rash.  Neurological:  Negative for dizziness, tingling,  seizures and headaches.  Endo/Heme/Allergies:  Negative for polydipsia.  Psychiatric/Behavioral:  Negative for depression. The patient is not nervous/anxious.    Objective:   BP 102/64 (BP Location: Left Arm, Patient Position: Sitting, Cuff Size: Normal)    Pulse (!) 55    Temp 98.2 F (36.8 C) (Temporal)    Ht 5\' 6"  (1.676 m)    Wt 128 lb (58.1 kg)    LMP 02/21/2021 (Approximate)    SpO2 99%    BMI 20.66 kg/m  Body mass index is 20.66 kg/m.   General Appearance:    Alert, cooperative, no distress, appears stated age  Head:    Normocephalic, without obvious abnormality, atraumatic  Eyes:    PERRL, conjunctiva/corneas clear, EOM's intact, fundi    benign, both eyes  Ears:    Normal TM's and external ear canals,  both ears  Nose:   Nares normal, septum midline, mucosa normal, no drainage    or sinus tenderness  Throat:   Lips, mucosa, and tongue normal; teeth and gums normal  Neck:   Supple, symmetrical, trachea midline, no adenopathy;    thyroid:  no enlargement/tenderness/nodules; no carotid   bruit or JVD  Back:     Symmetric, no curvature, ROM normal, no CVA tenderness  Lungs:     Clear to auscultation bilaterally, respirations unlabored  Chest Wall:    No tenderness or deformity   Heart:    Regular rate and rhythm, S1 and S2 normal, no murmur, rub or gallop  Breast Exam:    Deferred  Abdomen:     Soft, non-tender, bowel sounds active all four quadrants,    no masses, no organomegaly  Genitalia:    Deferred  Extremities:   Extremities normal, atraumatic, no cyanosis or edema  Pulses:   2+ and symmetric all extremities  Skin:   Skin color, texture, turgor normal, no rashes or lesions  Lymph nodes:   Cervical, supraclavicular, and axillary nodes normal  Neurologic:   CNII-XII intact, normal strength, sensation and reflexes    throughout    Assessment/Plan:   Encounter for general adult medical examination with abnormal findings Today patient counseled on age appropriate  routine health concerns for screening and prevention, each reviewed and up to date or declined. Immunizations reviewed and up to date or declined. Labs ordered and reviewed. Risk factors for depression reviewed and negative. Hearing function and visual acuity are intact. ADLs screened and addressed as needed. Functional ability and level of safety reviewed and appropriate. Education, counseling and referrals performed based on assessed risks today. Patient provided with a copy of personalized plan for preventive services.  Encounter for lipid screening for cardiovascular disease Update lipid panel will start medication as indicated by results  Will likely order Calcium score CT for further evaluation  Follow up as needed   Anxiety Controlled; No red flags;Denies SI/HI Continue prozac 10 mg daily   I advised patient that if they develop any SI, to tell someone immediately and seek medical attention Follow up in63 months, sooner if concerns  Seasonal allergies Uncontrolled Continue claritin Recommend: Nasal saline spray (i.e., Simply Saline) or nasal saline lavage (i.e., NeilMed) is recommended as needed and prior to medicated nasal sprays. Trial astelin nasal spray in place of flonase. Use once spray in AM and PM. Follow-up as needed  Hip pain Overall controlled Seeing Murphy-Weiner for PRP injections and considering surgery Trialing low dose naltrexone - continue to advance as tolerated to 4.5 mg  Patient Counseling:    Nutrition: Stressed importance of moderation in sodium/caffeine intake, saturated fat and cholesterol, caloric balance, sufficient intake of fresh fruits, vegetables, fiber, calcium, iron, and 1 mg of folate supplement per day (for females capable of pregnancy).     Stressed the importance of regular exercise.      Substance Abuse: Discussed cessation/primary prevention of tobacco, alcohol, or other drug use; driving or other dangerous activities under the  influence; availability of treatment for abuse.      Injury prevention: Discussed safety belts, safety helmets, smoke detector, smoking near bedding or upholstery.      Sexuality: Discussed sexually transmitted diseases, partner selection, use of condoms, avoidance of unintended pregnancy  and contraceptive alternatives.     Dental health: Discussed importance of regular tooth brushing, flossing, and dental visits.     Health maintenance and immunizations reviewed. Please  refer to Health maintenance section.   I,Havlyn C Ratchford,acting as a Neurosurgeon for [employer], PA.,have documented all relevant documentation on the behalf of Jarold Motto, PA,as directed by  Jarold Motto, PA while in the presence of Jarold Motto, [place].  I, Jarold Motto, [place], have reviewed all documentation for this visit. The documentation on 03/07/21 for the exam, diagnosis, procedures, and orders are all accurate and complete.  Jarold Motto, PA-C [place] [facility]

## 2021-03-09 ENCOUNTER — Other Ambulatory Visit: Payer: Self-pay | Admitting: *Deleted

## 2021-03-09 MED ORDER — FLUOXETINE HCL 10 MG PO CAPS: ORAL_CAPSULE | ORAL | 1 refills | Status: DC

## 2021-04-03 ENCOUNTER — Ambulatory Visit (INDEPENDENT_AMBULATORY_CARE_PROVIDER_SITE_OTHER)
Admission: RE | Admit: 2021-04-03 | Discharge: 2021-04-03 | Disposition: A | Discharge status: Home / Self Care | Payer: Self-pay | Source: Ambulatory Visit | Attending: Physician Assistant | Admitting: Physician Assistant

## 2021-04-03 ENCOUNTER — Other Ambulatory Visit: Payer: Self-pay

## 2021-04-03 DIAGNOSIS — E785 Hyperlipidemia, unspecified: Secondary | ICD-10-CM

## 2021-04-04 ENCOUNTER — Telehealth: Payer: Self-pay | Admitting: Physician Assistant

## 2021-04-04 NOTE — Telephone Encounter (Signed)
See note

## 2021-04-04 NOTE — Telephone Encounter (Signed)
[name] audiology called stated if [name] can please take a look at Patients CT cardiac score report.   ? ?

## 2021-04-05 ENCOUNTER — Ambulatory Visit (HOSPITAL_BASED_OUTPATIENT_CLINIC_OR_DEPARTMENT_OTHER): Payer: 59

## 2021-04-05 ENCOUNTER — Other Ambulatory Visit: Payer: Self-pay | Admitting: Physician Assistant

## 2021-04-05 DIAGNOSIS — R9389 Abnormal findings on diagnostic imaging of other specified body structures: Secondary | ICD-10-CM

## 2021-04-05 NOTE — Telephone Encounter (Signed)
This has been addressed.

## 2021-04-09 ENCOUNTER — Ambulatory Visit (HOSPITAL_BASED_OUTPATIENT_CLINIC_OR_DEPARTMENT_OTHER)
Admission: RE | Admit: 2021-04-09 | Discharge: 2021-04-09 | Disposition: A | Discharge status: Home / Self Care | Payer: 59 | Source: Ambulatory Visit | Attending: Physician Assistant | Admitting: Physician Assistant

## 2021-04-09 DIAGNOSIS — R9389 Abnormal findings on diagnostic imaging of other specified body structures: Secondary | ICD-10-CM | POA: Insufficient documentation

## 2021-04-13 ENCOUNTER — Other Ambulatory Visit: Payer: 59

## 2021-04-16 ENCOUNTER — Encounter: Payer: Self-pay | Admitting: Physician Assistant

## 2021-04-18 ENCOUNTER — Other Ambulatory Visit: Payer: Self-pay | Admitting: Physician Assistant

## 2021-07-22 ENCOUNTER — Encounter: Payer: Self-pay | Admitting: Physician Assistant

## 2021-08-22 ENCOUNTER — Other Ambulatory Visit: Payer: Self-pay | Admitting: Physician Assistant

## 2021-09-07 ENCOUNTER — Encounter: Payer: Self-pay | Admitting: Physician Assistant

## 2021-09-07 ENCOUNTER — Ambulatory Visit (INDEPENDENT_AMBULATORY_CARE_PROVIDER_SITE_OTHER): Payer: 59 | Admitting: Physician Assistant

## 2021-09-07 VITALS — BP 120/60 | HR 68 | Temp 97.7°F | Ht 66.0 in | Wt 124.0 lb

## 2021-09-07 DIAGNOSIS — Z1159 Encounter for screening for other viral diseases: Secondary | ICD-10-CM | POA: Diagnosis not present

## 2021-09-07 DIAGNOSIS — R748 Abnormal levels of other serum enzymes: Secondary | ICD-10-CM | POA: Diagnosis not present

## 2021-09-07 LAB — HEPATIC FUNCTION PANEL
ALT: 10 U/L (ref 0–35)
AST: 15 U/L (ref 0–37)
Albumin: 4 g/dL (ref 3.5–5.2)
Alkaline Phosphatase: 41 U/L (ref 39–117)
Bilirubin, Direct: 0.1 mg/dL (ref 0.0–0.3)
Total Bilirubin: 0.4 mg/dL (ref 0.2–1.2)
Total Protein: 6.5 g/dL (ref 6.0–8.3)

## 2021-09-07 NOTE — Progress Notes (Signed)
Ashley Adkins is a 41 y.o. female here for a new problem.  History of Present Illness:   Chief Complaint  Patient presents with   Elevated Hepatic Enzymes    Pt here to f/u on elevated liver enzymes AST was 99.    HPI  Elevated liver labs She went to her rheumatology office on 08/29/21 and had routine blood work done. Dr. Amil Amen is evaluating her for possible psoriatic arthritis. Her blood work results were the following:  Creatinine 1.01  AST 99 ALT 31 Alk phose 47 Total bili 0.3  She was told to follow-up with [place] for elevated liver lab. She does not drink alcohol. She does not take tylenol. She takes multiple work-out and vitamin/mineral supplements. She has recently stopped these after getting these results. She is exercising regularly.   She does take naltrexone 5 mg daily. She is uncertain if this is actually helpful for her symptoms.  She denies abdominal pain. Does get occasional abdominal fullness.  Past Medical History:  Diagnosis Date   Allergy 2006   ANA positive    Anxiety    Arthritis    Colon polyps 2018   History of chicken pox    Hyperlipidemia    never on medication   Vaginal delivery 2011, 2014     Social History   Tobacco Use   Smoking status: Never   Smokeless tobacco: Never  Vaping Use   Vaping Use: Never used  Substance Use Topics   Alcohol use: No   Drug use: No    Past Surgical History:  Procedure Laterality Date   HIP SURGERY Left    HIP SURGERY Bilateral    Labrial tear, left 2007, right 2020 done arthroscopic    Family History  Problem Relation Age of Onset   Asthma Mother    Other Father        Bicuspid Valve   Heart attack Maternal Grandmother    Heart attack Maternal Grandfather    Breast cancer Paternal Grandmother    Heart attack Paternal Grandfather     Allergies  Allergen Reactions   Latex Rash    Current Medications:   Current Outpatient Medications:    Cholecalciferol (VITAMIN D3) 125 MCG (5000 UT) CAPS,  Take 1 capsule by mouth daily., Disp: , Rfl:    diazepam (VALIUM) 5 MG tablet, TAKE 1 TABLET (5 MG TOTAL) BY MOUTH AT BEDTIME AS NEEDED FOR ANXIETY., Disp: 20 tablet, Rfl: 0   FLUoxetine (PROZAC) 10 MG capsule, TAKE 1 CAPSULE DAILY, Disp: 90 capsule, Rfl: 0   ketoconazole (NIZORAL) 2 % cream, SMARTSIG:1 Application Topical 1 to 2 Times Daily, Disp: , Rfl:    loratadine (CLARITIN) 10 MG tablet, Take 10 mg by mouth daily as needed for allergies., Disp: , Rfl:    MELATONIN GUMMIES PO, Take 3 mg by mouth at bedtime., Disp: , Rfl:    NALTREXONE HCL PO, Take 5 mg by mouth daily in the afternoon., Disp: , Rfl:    predniSONE (STERAPRED UNI-PAK 48 TAB) 5 MG (48) TBPK tablet, Take 5 mg by mouth as directed., Disp: , Rfl:    Probiotic Product (PROBIOTIC DAILY PO), Take by mouth daily. 85 BILLION, Disp: , Rfl:    VITAMIN K PO, Take 45 mcg by mouth daily., Disp: , Rfl:    Zinc 50 MG TABS, 1 tablet, Disp: , Rfl:    Review of Systems:   ROS Negative unless otherwise specified per HPI.  Vitals:   Vitals:   09/07/21 1110  BP: 120/60  Pulse: 68  Temp: 97.7 F (36.5 C)  TempSrc: Temporal  SpO2: 98%  Weight: 124 lb (56.2 kg)  Height: 5' 6"  (1.676 m)     Body mass index is 20.01 kg/m.  Physical Exam:   Physical Exam Vitals and nursing note reviewed.  Constitutional:      General: She is not in acute distress.    Appearance: She is well-developed. She is not ill-appearing or toxic-appearing.  Cardiovascular:     Rate and Rhythm: Normal rate and regular rhythm.     Pulses: Normal pulses.     Heart sounds: Normal heart sounds, S1 normal and S2 normal.  Pulmonary:     Effort: Pulmonary effort is normal.     Breath sounds: Normal breath sounds.  Abdominal:     General: Abdomen is flat. Bowel sounds are normal.     Palpations: Abdomen is soft.     Tenderness: There is no abdominal tenderness.  Skin:    General: Skin is warm and dry.  Neurological:     Mental Status: She is alert.     GCS:  GCS eye subscore is 4. GCS verbal subscore is 5. GCS motor subscore is 6.  Psychiatric:        Speech: Speech normal.        Behavior: Behavior normal. Behavior is cooperative.     Assessment and Plan:   Elevated liver enzymes No red flags Recheck blood work and trend values I agree with holding all supplements at this time Consider stopping naltrexone since this is not providing much relief of her symptoms anyway If remains elevated, will obtain RUQ u/s  Encounter for screening for other viral diseases Update Hep C screen today   Inda Coke, PA-C

## 2021-09-07 NOTE — Patient Instructions (Signed)
It was great to see you!  We will be in touch with your results and the recommendations!  Ashley Adkins

## 2021-09-10 LAB — HEPATITIS C ANTIBODY: Hepatitis C Ab: NONREACTIVE

## 2021-09-28 ENCOUNTER — Encounter: Payer: Self-pay | Admitting: Physician Assistant

## 2021-10-31 ENCOUNTER — Encounter: Payer: Self-pay | Admitting: Physician Assistant

## 2021-11-21 ENCOUNTER — Other Ambulatory Visit: Payer: Self-pay | Admitting: Physician Assistant

## 2022-02-12 ENCOUNTER — Other Ambulatory Visit: Payer: Self-pay | Admitting: Obstetrics & Gynecology

## 2022-02-12 DIAGNOSIS — Z1231 Encounter for screening mammogram for malignant neoplasm of breast: Secondary | ICD-10-CM

## 2022-02-15 LAB — COMPREHENSIVE METABOLIC PANEL: EGFR: 76

## 2022-02-22 ENCOUNTER — Ambulatory Visit (INDEPENDENT_AMBULATORY_CARE_PROVIDER_SITE_OTHER): Payer: 59 | Admitting: Physician Assistant

## 2022-02-22 ENCOUNTER — Other Ambulatory Visit (HOSPITAL_COMMUNITY)
Admission: RE | Admit: 2022-02-22 | Discharge: 2022-02-22 | Disposition: A | Discharge status: Home / Self Care | Payer: 59 | Source: Ambulatory Visit | Attending: Physician Assistant | Admitting: Physician Assistant

## 2022-02-22 ENCOUNTER — Encounter: Payer: Self-pay | Admitting: Physician Assistant

## 2022-02-22 VITALS — BP 102/60 | HR 45 | Temp 97.3°F | Ht 66.0 in | Wt 130.5 lb

## 2022-02-22 DIAGNOSIS — N760 Acute vaginitis: Secondary | ICD-10-CM

## 2022-02-22 DIAGNOSIS — R5383 Other fatigue: Secondary | ICD-10-CM | POA: Diagnosis not present

## 2022-02-22 LAB — TSH: TSH: 0.7 u[IU]/mL (ref 0.35–5.50)

## 2022-02-22 LAB — IBC + FERRITIN
Ferritin: 20 ng/mL (ref 10.0–291.0)
Iron: 99 ug/dL (ref 42–145)
Saturation Ratios: 26.5 % (ref 20.0–50.0)
TIBC: 373.8 ug/dL (ref 250.0–450.0)
Transferrin: 267 mg/dL (ref 212.0–360.0)

## 2022-02-22 LAB — VITAMIN D 25 HYDROXY (VIT D DEFICIENCY, FRACTURES): VITD: 64.95 ng/mL (ref 30.00–100.00)

## 2022-02-22 LAB — FOLLICLE STIMULATING HORMONE: FSH: 4.5 m[IU]/mL

## 2022-02-22 LAB — VITAMIN B12: Vitamin B-12: 1118 pg/mL — ABNORMAL HIGH (ref 211–911)

## 2022-02-22 LAB — LUTEINIZING HORMONE: LH: 3.74 m[IU]/mL

## 2022-02-22 MED ORDER — FLUCONAZOLE 150 MG PO TABS: 150.0000 mg | ORAL_TABLET | Freq: Once | ORAL | 0 refills | Status: AC

## 2022-02-22 NOTE — Progress Notes (Signed)
Ashley Adkins is a 42 y.o. female here for a new problem.  History of Present Illness:   Chief Complaint  Patient presents with   vaginal odor    Pt c/o fishy vaginal odor x 1 month and irritation.    Fatigue    Pt is c/o increase in fatigue and wants to discuss medication.    HPI   Fatigue Patient is complaining of increased fatigue this visit. She states that she is not sure if it may be due to allergies, depression, or hormonal changes. She expresses that it has worsened over the last couple of months. Patient mentions that she was taking Humira, but has stopped taking it for 2 weeks to see if there are any improvements to her hands. She does not know if medication may be contributing to symptoms. She reports that she has tried to go to bed earlier and she is not feeling like her regular self. Patient is requesting hormonal testing.  She is taking prozac 10 mg and is uncertain if a higher dose would help her symptoms.  Will take prn valium  Vaginitis  Patient is complaining about a fishy vaginal odor occurring 1 month ago with irritation. She states that she suspects she may have BV and/or a yeast infection. Patient has managed symptoms with different probiotics. She is not interested in having a STD test completed.   Past Medical History:  Diagnosis Date   Allergy 2006   ANA positive    Anxiety    Arthritis    Colon polyps 2018   History of chicken pox    Hyperlipidemia    never on medication   Vaginal delivery 2011, 2014     Social History   Tobacco Use   Smoking status: Never   Smokeless tobacco: Never  Vaping Use   Vaping Use: Never used  Substance Use Topics   Alcohol use: No   Drug use: No    Past Surgical History:  Procedure Laterality Date   HIP SURGERY Left    HIP SURGERY Bilateral    Labrial tear, left 2007, right 2020 done arthroscopic    Family History  Problem Relation Age of Onset   Asthma Mother    Other Father        Bicuspid Valve    Heart attack Maternal Grandmother    Heart attack Maternal Grandfather    Breast cancer Paternal Grandmother    Heart attack Paternal Grandfather     Allergies  Allergen Reactions   Latex Rash    Current Medications:   Current Outpatient Medications:    Cholecalciferol (VITAMIN D3) 125 MCG (5000 UT) CAPS, Take 1 capsule by mouth daily., Disp: , Rfl:    diazepam (VALIUM) 5 MG tablet, TAKE 1 TABLET (5 MG TOTAL) BY MOUTH AT BEDTIME AS NEEDED FOR ANXIETY., Disp: 20 tablet, Rfl: 0   fexofenadine (ALLEGRA) 180 MG tablet, Take 180 mg by mouth daily., Disp: , Rfl:    FLUoxetine (PROZAC) 10 MG capsule, TAKE 1 CAPSULE DAILY (NEED APPOINTMENT), Disp: 90 capsule, Rfl: 1   ketoconazole (NIZORAL) 2 % cream, SMARTSIG:1 Application Topical 1 to 2 Times Daily, Disp: , Rfl:    MELATONIN GUMMIES PO, Take 3 mg by mouth at bedtime., Disp: , Rfl:    Probiotic Product (PROBIOTIC DAILY PO), Take by mouth daily. 85 BILLION, Disp: , Rfl:    VITAMIN K PO, Take 45 mcg by mouth daily., Disp: , Rfl:    Zinc 50 MG TABS, 1 tablet, Disp: ,  Rfl:    Review of Systems:   Review of Systems  Constitutional:  Positive for malaise/fatigue.  Skin:        (+) vaginitis     Vitals:   Vitals:   02/22/22 1125  BP: 102/60  Pulse: (!) 45  Temp: (!) 97.3 F (36.3 C)  TempSrc: Temporal  SpO2: 100%  Weight: 130 lb 8 oz (59.2 kg)  Height: 5' 6"$  (1.676 m)     Body mass index is 21.06 kg/m.  Physical Exam:   Physical Exam Constitutional:      General: She is not in acute distress.    Appearance: Normal appearance. She is not ill-appearing.  HENT:     Head: Normocephalic and atraumatic.     Right Ear: External ear normal.     Left Ear: External ear normal.  Eyes:     Extraocular Movements: Extraocular movements intact.     Pupils: Pupils are equal, round, and reactive to light.  Cardiovascular:     Rate and Rhythm: Normal rate and regular rhythm.     Heart sounds: Normal heart sounds. No murmur heard.     No gallop.  Pulmonary:     Effort: Pulmonary effort is normal. No respiratory distress.     Breath sounds: Normal breath sounds. No wheezing or rales.  Skin:    General: Skin is warm and dry.  Neurological:     Mental Status: She is alert and oriented to person, place, and time.  Psychiatric:        Judgment: Judgment normal.     Assessment and Plan:   Acute vaginitis Patient completed self-swab. Recommend diflucan 150 mg x 1 while we await results Consider follow-up with gyn if lack of improvement  Fatigue, unspecified type Update blood work and provide recommendations accordingly Will assess for possible organic causes for fatigue Consider increase of prozac to 20 mg daily Follow-up based on results and clinical symptoms  I,Verona Buck,acting as a scribe for [employer], PA.,have documented all relevant documentation on the behalf of Inda Coke, PA,as directed by  Inda Coke, PA while in the presence of Inda Coke, [place].  I, Inda Coke, [place], have reviewed all documentation for this visit. The documentation on 02/22/22 for the exam, diagnosis, procedures, and orders are all accurate and complete.  Inda Coke, PA-C

## 2022-02-22 NOTE — Patient Instructions (Signed)
It was great to see you!  Start one-time diflucan tablet   Consider xyzal for your allergies (generic is fine!)  We will get blood work to further assess your symptoms as well as your vaginal swab  Take care,  Inda Coke PA-C

## 2022-02-25 LAB — CERVICOVAGINAL ANCILLARY ONLY
Bacterial Vaginitis (gardnerella): NEGATIVE
Candida Glabrata: NEGATIVE
Candida Vaginitis: NEGATIVE
Comment: NEGATIVE
Comment: NEGATIVE
Comment: NEGATIVE

## 2022-03-28 ENCOUNTER — Ambulatory Visit
Admission: RE | Admit: 2022-03-28 | Discharge: 2022-03-28 | Disposition: A | Discharge status: Home / Self Care | Payer: 59 | Source: Ambulatory Visit | Attending: Obstetrics & Gynecology | Admitting: Obstetrics & Gynecology

## 2022-03-28 DIAGNOSIS — Z1231 Encounter for screening mammogram for malignant neoplasm of breast: Secondary | ICD-10-CM

## 2022-05-20 ENCOUNTER — Other Ambulatory Visit: Payer: Self-pay | Admitting: Physician Assistant

## 2022-06-18 ENCOUNTER — Encounter: Payer: Self-pay | Admitting: Physician Assistant

## 2022-06-18 ENCOUNTER — Other Ambulatory Visit: Payer: Self-pay | Admitting: Physician Assistant

## 2022-06-18 MED ORDER — DIAZEPAM 5 MG PO TABS: 5.0000 mg | ORAL_TABLET | Freq: Every evening | ORAL | 0 refills | Status: DC | PRN

## 2022-06-18 NOTE — Telephone Encounter (Signed)
Last OV: 02/22/22  Next OV: none  Last filled: 12/18/20  Quantity: 20 tablets

## 2022-07-04 ENCOUNTER — Other Ambulatory Visit: Payer: Self-pay | Admitting: Physician Assistant

## 2022-07-04 MED ORDER — FLUOXETINE HCL 10 MG PO CAPS: 10.0000 mg | ORAL_CAPSULE | Freq: Every day | ORAL | 0 refills | Status: DC

## 2022-07-19 ENCOUNTER — Encounter: Payer: Self-pay | Admitting: Physician Assistant

## 2022-07-19 ENCOUNTER — Ambulatory Visit: Payer: Managed Care, Other (non HMO) | Admitting: Physician Assistant

## 2022-07-19 VITALS — BP 100/60 | HR 63 | Temp 97.5°F | Ht 66.0 in | Wt 130.5 lb

## 2022-07-19 DIAGNOSIS — Z0001 Encounter for general adult medical examination with abnormal findings: Secondary | ICD-10-CM | POA: Diagnosis not present

## 2022-07-19 DIAGNOSIS — E785 Hyperlipidemia, unspecified: Secondary | ICD-10-CM | POA: Diagnosis not present

## 2022-07-19 DIAGNOSIS — M6289 Other specified disorders of muscle: Secondary | ICD-10-CM

## 2022-07-19 DIAGNOSIS — Z23 Encounter for immunization: Secondary | ICD-10-CM

## 2022-07-19 DIAGNOSIS — F5101 Primary insomnia: Secondary | ICD-10-CM | POA: Diagnosis not present

## 2022-07-19 DIAGNOSIS — L4050 Arthropathic psoriasis, unspecified: Secondary | ICD-10-CM

## 2022-07-19 DIAGNOSIS — F419 Anxiety disorder, unspecified: Secondary | ICD-10-CM

## 2022-07-19 DIAGNOSIS — Z0000 Encounter for general adult medical examination without abnormal findings: Secondary | ICD-10-CM

## 2022-07-19 MED ORDER — FLUOXETINE HCL 10 MG PO CAPS: 10.0000 mg | ORAL_CAPSULE | Freq: Every day | ORAL | 3 refills | Status: DC

## 2022-07-19 MED ORDER — TRAZODONE HCL 50 MG PO TABS: 50.0000 mg | ORAL_TABLET | Freq: Every evening | ORAL | 0 refills | Status: DC | PRN

## 2022-07-19 NOTE — Progress Notes (Signed)
Subjective:    Ashley Adkins is a 42 y.o. female and is here for a comprehensive physical exam.   Health Maintenance Due  Topic Date Due   DTaP/Tdap/Td (2 - Td or Tdap) 06/30/2022    Acute Concerns: None.   Chronic Issues: Insomnia: She is interested in trialing Trazadone for a low-risk of reliance medication, instead of Valium.  Reports that she takes melatonin daily to help fall asleep.  Notes that antihistamines, namely Benadryl, causes her to experience sleep paralysis.    Pelvic Floor Tightness: Reports she is still experiencing pelvic tightness. She is interested in establishing with Gwen Pounds L.M.B.T at [facility].  Anxiety/Mood disruptions prior to menses States that her last period was the most painful she'd experienced. Notes she used the highest absorption tampon and had to change it out every hour, interrupting her sleep. Receptive to hormonal IUD. Interested in trialing increase in Prozac to elevate her mood outside of her period.   Psoriatic arthritis: Well managed with 150 mg [employer].   Health Maintenance: Immunizations -- Tetanus, Diphtheria, and Pertussis (Tdap) today Colonoscopy -- UTD 12/28/2021. Results normal. Mammogram -- UTD 03/28/2022. Results normal. PAP -- UTD 11/14/2020. Results normal. Bone Density -- N/A Diet -- Overall healthy Exercise -- Regular  Sleep habits -- No concerns.  Mood -- Stable.  UTD with dentist? - yes UTD with eye doctor? - yes  Weight history: Wt Readings from Last 10 Encounters:  02/22/22 130 lb 8 oz (59.2 kg)  09/07/21 124 lb (56.2 kg)  03/07/21 128 lb (58.1 kg)  07/17/20 128 lb 6.4 oz (58.2 kg)  04/04/20 131 lb 9.6 oz (59.7 kg)  01/20/20 128 lb (58.1 kg)  03/30/19 128 lb 6.1 oz (58.2 kg)  11/17/18 129 lb (58.5 kg)  10/27/18 130 lb (59 kg)  01/12/18 126 lb 9.6 oz (57.4 kg)   There is no height or weight on file to calculate BMI. No LMP recorded.  Alcohol use:  reports no  history of alcohol use.  Tobacco use:  Tobacco Use: Low Risk  (02/22/2022)   Patient History    Smoking Tobacco Use: Never    Smokeless Tobacco Use: Never    Passive Exposure: Not on file   Eligible for lung cancer screening? no     02/22/2022   11:28 AM  Depression screen PHQ 2/9  Decreased Interest 2  Down, Depressed, Hopeless 1  PHQ - 2 Score 3  Altered sleeping 3  Tired, decreased energy 3  Change in appetite 3  Feeling bad or failure about yourself  1  Trouble concentrating 0  Moving slowly or fidgety/restless 0  Suicidal thoughts 0  PHQ-9 Score 13  Difficult doing work/chores Somewhat difficult     Other providers/specialists: Patient Care Team: Jarold Motto, [place] as PCP - General (Physician Assistant) Andrena Mews, DO as Consulting Physician (Sports Medicine) [facility], PT as Physical Therapist (Physical Therapy) Associates, [facility] as Consulting Physician (Obstetrics and Gynecology)    PMHx, SurgHx, SocialHx, Medications, and Allergies were reviewed in the Visit Navigator and updated as appropriate.   Past Medical History:  Diagnosis Date   Allergy 2006   ANA positive    Anxiety    Arthritis    Colon polyps 2018   History of chicken pox    Hyperlipidemia    never on medication   Vaginal delivery 2011, 2014     Past Surgical History:  Procedure Laterality Date   HIP SURGERY Left  HIP SURGERY Bilateral    Labrial tear, left 2007, right 2020 done arthroscopic     Family History  Problem Relation Age of Onset   Asthma Mother    Other Father        Bicuspid Valve   Heart attack Maternal Grandmother    Heart attack Maternal Grandfather    Breast cancer Paternal Grandmother    Heart attack Paternal Grandfather     Social History   Tobacco Use   Smoking status: Never   Smokeless tobacco: Never  Vaping Use   Vaping status: Never Used  Substance Use Topics   Alcohol use: No   Drug use: No    Review of Systems:    Review of Systems  Constitutional:  Negative for chills, fever, malaise/fatigue and weight loss.  HENT:  Negative for hearing loss, sinus pain and sore throat.   Respiratory:  Negative for cough and hemoptysis.   Cardiovascular:  Negative for chest pain, palpitations, leg swelling and PND.  Gastrointestinal:  Negative for abdominal pain, constipation, diarrhea, heartburn, nausea and vomiting.  Genitourinary:  Negative for dysuria, frequency and urgency.       (+) Abnormal menstrual cycle -- See HPI  Musculoskeletal:  Negative for back pain, myalgias and neck pain.       (+) Pelvic tightness  Skin:  Negative for itching and rash.  Neurological:  Negative for dizziness, tingling, seizures and headaches.  Endo/Heme/Allergies:  Negative for polydipsia.  Psychiatric/Behavioral:  Negative for depression. The patient is not nervous/anxious.     Objective:   There were no vitals taken for this visit. There is no height or weight on file to calculate BMI.   General Appearance:    Alert, cooperative, no distress, appears stated age  Head:    Normocephalic, without obvious abnormality, atraumatic  Eyes:    PERRL, conjunctiva/corneas clear, EOM's intact, fundi    benign, both eyes  Ears:    Normal TM's and external ear canals, both ears  Nose:   Nares normal, septum midline, mucosa normal, no drainage    or sinus tenderness  Throat:   Lips, mucosa, and tongue normal; teeth and gums normal  Neck:   Supple, symmetrical, trachea midline, no adenopathy;    thyroid:  no enlargement/tenderness/nodules; no carotid   bruit or JVD  Back:     Symmetric, no curvature, ROM normal, no CVA tenderness  Lungs:     Clear to auscultation bilaterally, respirations unlabored  Chest Wall:    No tenderness or deformity   Heart:    Regular rate and rhythm, S1 and S2 normal, no murmur, rub or gallop  Breast Exam:    Deferred  Abdomen:     Soft, non-tender, bowel sounds active all four quadrants,    no masses,  no organomegaly  Genitalia:    Deferred  Extremities:   Extremities normal, atraumatic, no cyanosis or edema  Pulses:   2+ and symmetric all extremities  Skin:   Skin color, texture, turgor normal, no rashes or lesions  Lymph nodes:   Cervical, supraclavicular, and axillary nodes normal  Neurologic:   CNII-XII intact, normal strength, sensation and reflexes    throughout    Assessment/Plan:   Routine physical examination Today patient counseled on age appropriate routine health concerns for screening and prevention, each reviewed and up to date or declined. Immunizations reviewed and up to date or declined. Labs ordered and reviewed. Risk factors for depression reviewed and negative. Hearing function and visual acuity are  intact. ADLs screened and addressed as needed. Functional ability and level of safety reviewed and appropriate. Education, counseling and referrals performed based on assessed risks today. Patient provided with a copy of personalized plan for preventive services.  Hyperlipidemia, unspecified hyperlipidemia type Update lipid panel and make recommendations accordingly  Primary insomnia Trial trazodone Risks, benefits, side effect(s) discussed Message me if lack of improvement  Muscle tightness Referral given per her request to her fascia-release specialist for ongoing treatment  Psoriatic arthritis (HCC) Well controlled Management per Dierdre Forth at [facility]  Need for prophylactic vaccination with combined diphtheria-tetanus-pertussis (DTP) vaccine Updated today  Anxiety Overall stable She is going to trial an increase in Prozac week prior to period and will message me if she would like to continue this prescription Denies suicidal ideation/hi   I,Emily Lagle,acting as a scribe for [employer], PA.,have documented all relevant documentation on the behalf of Jarold Motto, PA,as directed by  Jarold Motto, PA while in the presence of Jarold Motto,  [place].  I, Jarold Motto, [place], have reviewed all documentation for this visit. The documentation on 07/19/22 for the exam, diagnosis, procedures, and orders are all accurate and complete.  Jarold Motto, PA-C [place]

## 2022-07-19 NOTE — Patient Instructions (Signed)
It was great to see you!  Trial 25-50 mg trazodone for sleeping  Trial increase Prozac to 20 mg week before period  Message me on how this goes!  Please make an appointment with the lab on your way out. I would like for you to return for lab work within 1-2 weeks. After midnight on the day of the lab draw, please do not eat anything. You may have water, black coffee, unsweetened tea.  Take care,  Jarold Motto PA-C

## 2022-07-23 ENCOUNTER — Other Ambulatory Visit: Payer: 59

## 2022-07-29 ENCOUNTER — Other Ambulatory Visit: Payer: 59

## 2022-07-30 ENCOUNTER — Telehealth: Payer: Self-pay | Admitting: *Deleted

## 2022-07-30 MED ORDER — TRAZODONE HCL 50 MG PO TABS: 50.0000 mg | ORAL_TABLET | Freq: Every evening | ORAL | 1 refills | Status: DC | PRN

## 2022-07-30 NOTE — Addendum Note (Signed)
Addended by: Jimmye Norman on: 07/30/2022 09:15 AM   Modules accepted: Orders

## 2022-07-30 NOTE — Telephone Encounter (Signed)
Pt requesting refill for Trazodone 50 mg, 90 day supply to mail order. Okay to send?

## 2022-08-15 ENCOUNTER — Ambulatory Visit: Payer: Managed Care, Other (non HMO) | Admitting: Physician Assistant

## 2022-08-16 ENCOUNTER — Ambulatory Visit: Payer: Managed Care, Other (non HMO) | Admitting: Physician Assistant

## 2022-08-26 ENCOUNTER — Encounter: Payer: Self-pay | Admitting: Physician Assistant

## 2022-08-26 MED ORDER — FLUOXETINE HCL 20 MG PO CAPS: 20.0000 mg | ORAL_CAPSULE | Freq: Every day | ORAL | 3 refills | Status: DC

## 2022-08-26 NOTE — Telephone Encounter (Signed)
See message, Prozac Rx for 20 mg sent to pharmacy.

## 2022-09-04 ENCOUNTER — Other Ambulatory Visit: Payer: Self-pay | Admitting: *Deleted

## 2022-09-04 MED ORDER — FLUOXETINE HCL 20 MG PO CAPS: 20.0000 mg | ORAL_CAPSULE | Freq: Every day | ORAL | 1 refills | Status: DC

## 2022-10-07 IMAGING — CT CT CARDIAC CORONARY ARTERY CALCIUM SCORE
3 series · 13 of 20 positions shown, 15 images · non-contrast
Comparison: None.

Addendum:
CLINICAL DATA: Cardiovascular Disease Risk stratification

EXAM:
Coronary Calcium Score
TECHNIQUE: A gated, non-contrast computed tomography scan of the heart was
performed using 3mm slice thickness. Axial images were analyzed on a
dedicated workstation. Calcium scoring of the coronary arteries was
performed using the Agatston method.

[Series 2: cascseq 2.0 sa36 70% (id) · axial · 0.39mm/px · z∈[-247,-179]mm · 3 of 85 slices shown]
[im 17/85  vessel]
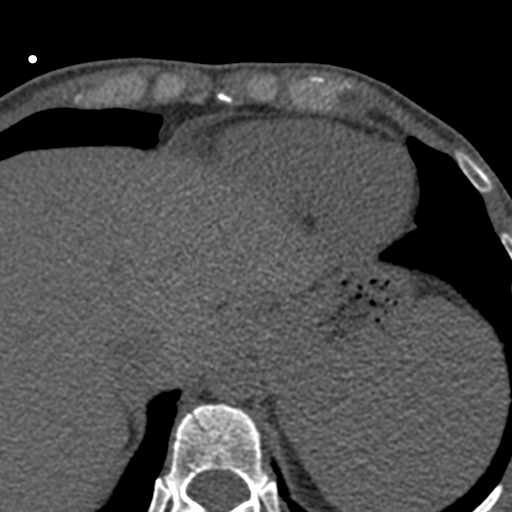
[im 34/85  vessel]
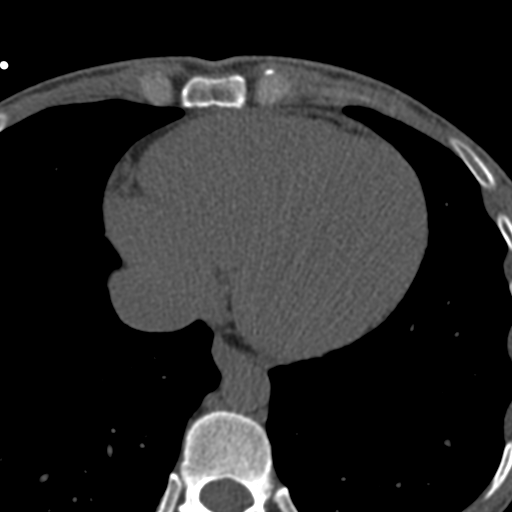
[im 51/85  vessel]
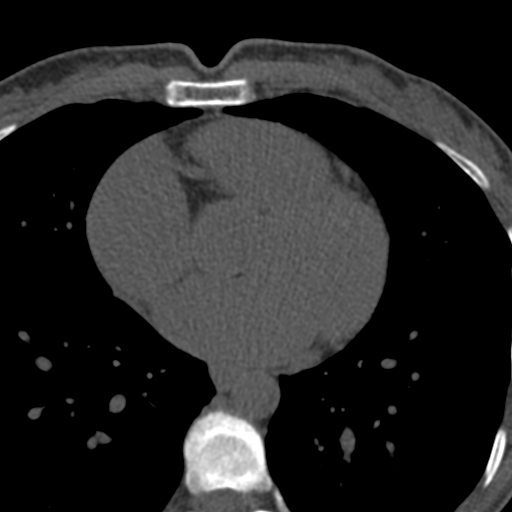

[Series 3: cascseq 2.0 bf37 st · axial · 0.61mm/px · z∈[-251,-139]mm · 5 of 85 slices shown, 7 images]
[im 15/85  vessel]
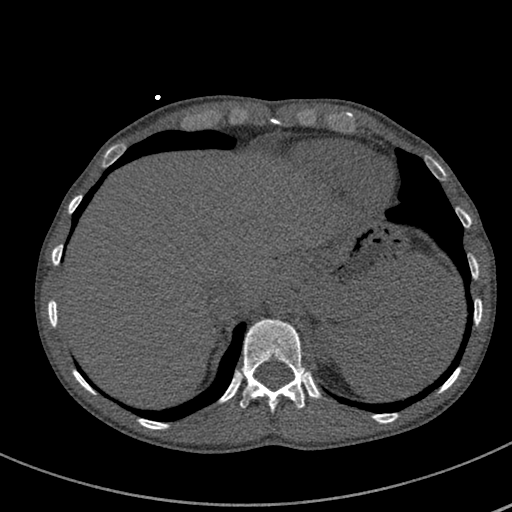
[im 15/85  lung]
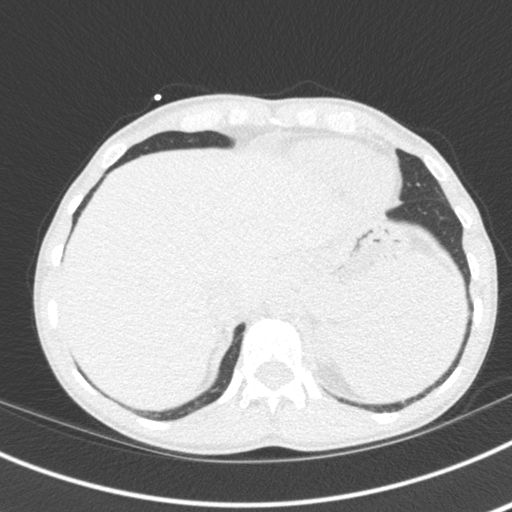
[im 29/85  vessel]
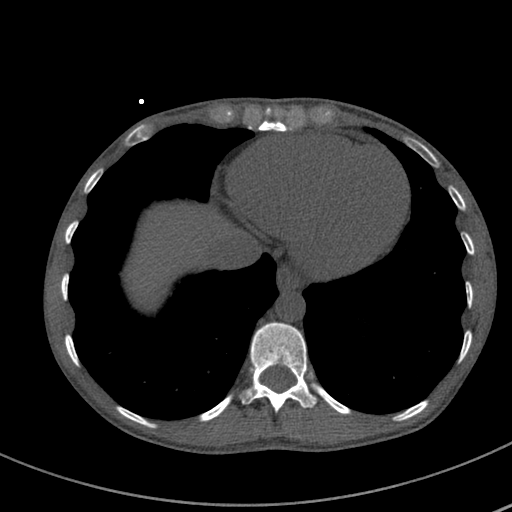
[im 43/85  vessel]
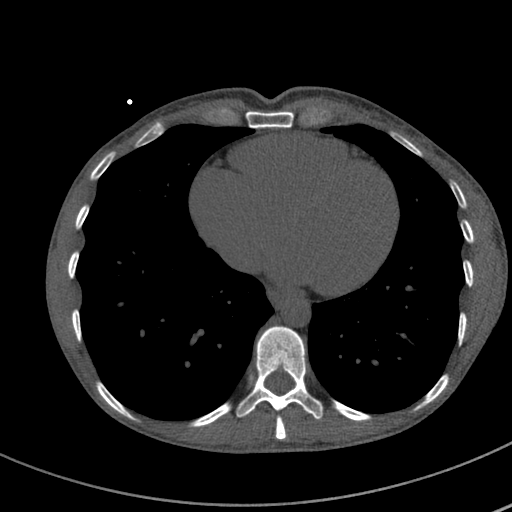
[im 57/85  vessel]
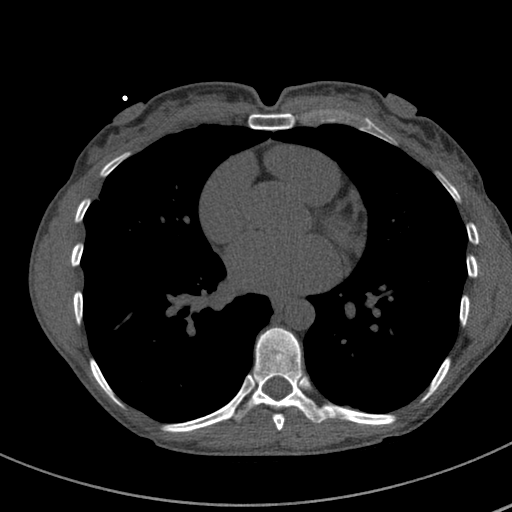
[im 71/85  vessel]
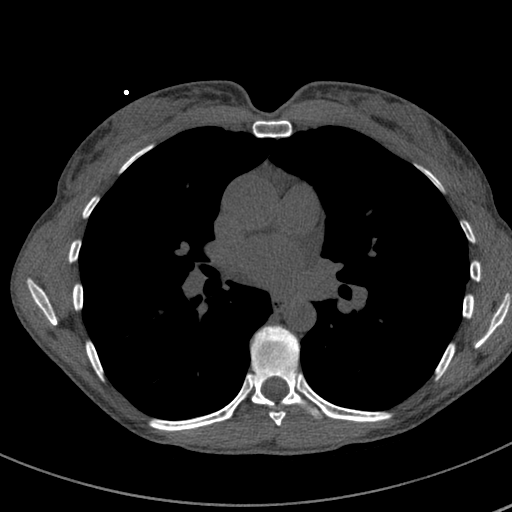
[im 71/85  lung]
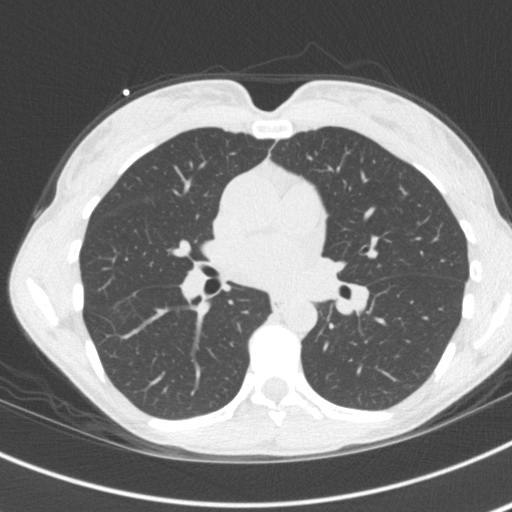

[Series 4: cascseq 2.0 br59 lung · axial · 0.61mm/px · z∈[-251,-139]mm · 5 of 85 slices shown]
[im 15/85  lung]
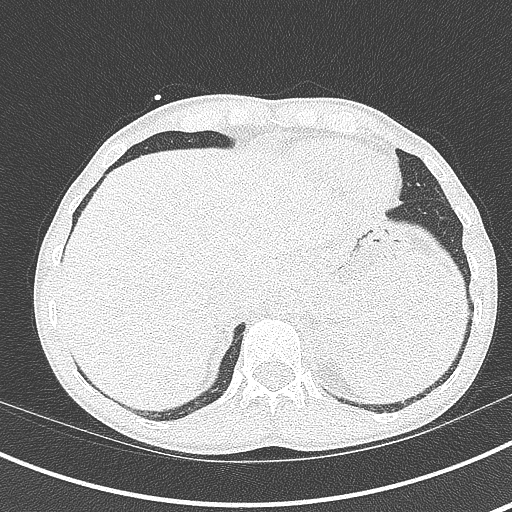
[im 29/85  lung]
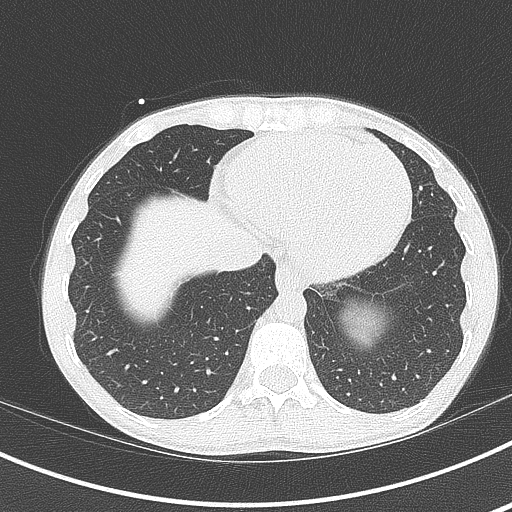
[im 43/85  lung]
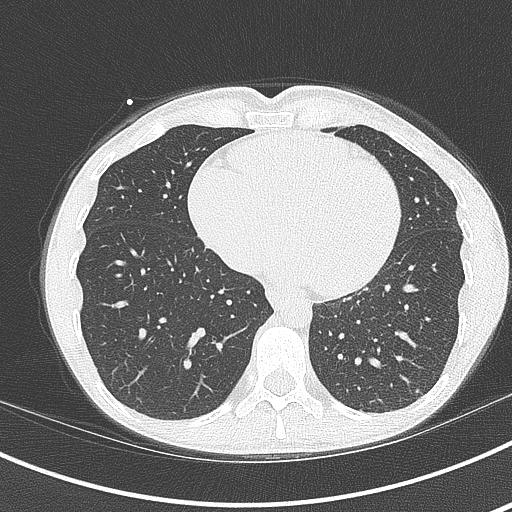
[im 57/85  lung]
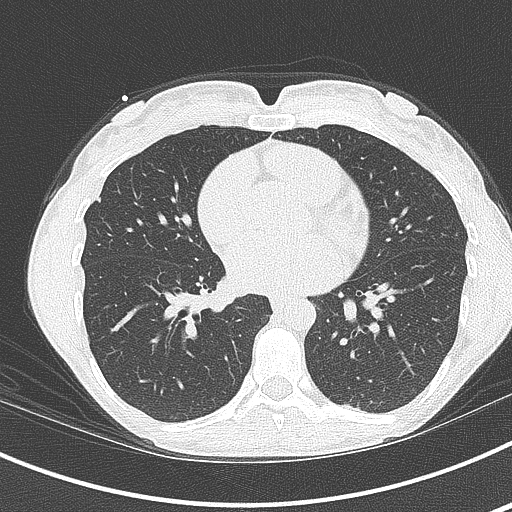
[im 71/85  lung]
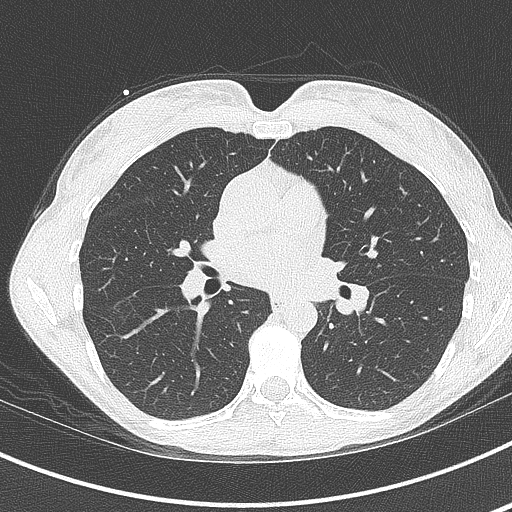

[13 of 20 positions shown; findings below may reference images not displayed]

FINDINGS: Coronary arteries: Normal origins.

Coronary Calcium Score:

Left main: 0

Left anterior descending artery: 0

Left circumflex artery: 0

Right coronary artery: 0

Total: 0

Percentile: 0

Pericardium: Normal.

Ascending Aorta: Normal caliber.

Non-cardiac: See separate report from [REDACTED].
IMPRESSION: Coronary calcium score of 0. This was 0 percentile for age-, race-,
and sex-matched controls.



If CAC=0, it is reasonable to withhold statin therapy and reassess
in 5 to 10 years, as long as higher risk conditions are absent
(diabetes mellitus, family history of premature CHD in first degree
relatives (males <55 years; females <65 years), cigarette smoking,
or LDL >=190 mg/dL).

If CAC is 1 to 99, it is reasonable to initiate statin therapy for
patients >=55 years of age.

If CAC is >=100 or >=75th percentile, it is reasonable to initiate
statin therapy at any age.

Cardiology referral should be considered for patients with CAC
scores >=400 or >=75th percentile.

*8096 AHA/ACC/AACVPR/AAPA/ABC/SUHREN/ERDESZ/GARICITO/Bobr/VITARAS/HEY/ROBERTO
Guideline on the Management of Blood Cholesterol: A Report of the
American College of Cardiology/American Heart Association Task Force
on Clinical Practice Guidelines. J Am Coll Cardiol.
2062;73(24):0997-0119.

EXAM:
OVER-READ INTERPRETATION  CT CHEST

The following report is an over-read performed by radiologist Dr.
does not include interpretation of cardiac or coronary anatomy or
pathology. The coronary calcium score interpretation by the
cardiologist is attached.
FINDINGS: The entire chest is not imaged on this examination but there is
unusual collection of gas anterior to the right mainstem bronchus
and posterior to the superior vena cava on sequence 2, image 1. This
crescent shaped area of gas is indeterminate and does not appear to
represent normal lung tissue. This area is not well identified on
the topogram image. 3 mm nodule in the right middle lobe adjacent to
the right minor fissure on sequence 4, image 20. Additional small
subpleural nodule in the right middle lobe on image 29. 2 mm nodule
in the left lower lobe on sequence 4 image 26. No evidence for bulla
or blebs within the visualized lungs. No large pleural effusions.
Large amount of gas in the central aspect of the upper abdomen is
probably related to colon. No acute bone abnormality.
IMPRESSION: 1. Indeterminate gas-filled structure anterior to the right mainstem
bronchus and posterior to the superior vena cava. Findings could be
related to an anatomic variant but indeterminate and incompletely
imaged. Recommend a non contrast chest CT for further
characterization.
2. Small pulmonary nodules, largest measuring 3 mm. No follow-up
needed if patient is low-risk (and has no known or suspected primary
neoplasm). Non-contrast chest CT can be considered in 12 months if
patient is high-risk. This recommendation follows the consensus
statement: Guidelines for Management of Incidental Pulmonary Nodules
Detected on CT Images: From the [HOSPITAL] 2172; Radiology
2172; [DATE].

These results will be called to the ordering clinician or
representative by the Radiologist Assistant, and communication
documented in the PACS or [REDACTED].

*** End of Addendum ***
FINDINGS: Coronary arteries: Normal origins.

Coronary Calcium Score:

Left main: 0

Left anterior descending artery: 0

Left circumflex artery: 0

Right coronary artery: 0

Total: 0

Percentile: 0

Pericardium: Normal.

Ascending Aorta: Normal caliber.

Non-cardiac: See separate report from [REDACTED].
IMPRESSION: Coronary calcium score of 0. This was 0 percentile for age-, race-,
and sex-matched controls.



If CAC=0, it is reasonable to withhold statin therapy and reassess
in 5 to 10 years, as long as higher risk conditions are absent
(diabetes mellitus, family history of premature CHD in first degree
relatives (males <55 years; females <65 years), cigarette smoking,
or LDL >=190 mg/dL).

If CAC is 1 to 99, it is reasonable to initiate statin therapy for
patients >=55 years of age.

If CAC is >=100 or >=75th percentile, it is reasonable to initiate
statin therapy at any age.

Cardiology referral should be considered for patients with CAC
scores >=400 or >=75th percentile.

*8096 AHA/ACC/AACVPR/AAPA/ABC/SUHREN/ERDESZ/GARICITO/Bobr/VITARAS/HEY/ROBERTO
Guideline on the Management of Blood Cholesterol: A Report of the
American College of Cardiology/American Heart Association Task Force
on Clinical Practice Guidelines. J Am Coll Cardiol.
2062;73(24):0997-0119.

## 2022-12-11 ENCOUNTER — Other Ambulatory Visit: Payer: Self-pay

## 2022-12-11 ENCOUNTER — Other Ambulatory Visit: Payer: Self-pay | Admitting: Physician Assistant

## 2022-12-11 DIAGNOSIS — F419 Anxiety disorder, unspecified: Secondary | ICD-10-CM

## 2022-12-12 MED ORDER — DIAZEPAM 5 MG PO TABS: 5.0000 mg | ORAL_TABLET | Freq: Every evening | ORAL | 0 refills | Status: DC | PRN

## 2023-03-03 ENCOUNTER — Other Ambulatory Visit: Payer: Self-pay | Admitting: Physician Assistant

## 2023-04-08 LAB — HM MAMMOGRAPHY

## 2023-04-14 ENCOUNTER — Other Ambulatory Visit: Payer: Self-pay | Admitting: Physician Assistant

## 2023-04-14 NOTE — Telephone Encounter (Signed)
 Pt requesting refill for Valium 5 mg tablet. Last OV 07/18/2022.

## 2023-04-17 ENCOUNTER — Other Ambulatory Visit: Payer: Self-pay | Admitting: Obstetrics and Gynecology

## 2023-04-17 DIAGNOSIS — Z1231 Encounter for screening mammogram for malignant neoplasm of breast: Secondary | ICD-10-CM

## 2023-04-25 ENCOUNTER — Other Ambulatory Visit: Payer: Self-pay | Admitting: Obstetrics and Gynecology

## 2023-04-25 DIAGNOSIS — R92311 Mammographic fatty tissue density, right breast: Secondary | ICD-10-CM

## 2023-06-03 ENCOUNTER — Ambulatory Visit
Admission: RE | Admit: 2023-06-03 | Discharge: 2023-06-03 | Disposition: A | Discharge status: Home / Self Care | Source: Ambulatory Visit | Attending: Obstetrics and Gynecology | Admitting: Obstetrics and Gynecology

## 2023-06-03 DIAGNOSIS — R92311 Mammographic fatty tissue density, right breast: Secondary | ICD-10-CM

## 2023-06-03 MED ORDER — GADOPICLENOL 0.5 MMOL/ML IV SOLN
6.5000 mL | Freq: Once | INTRAVENOUS | Status: AC | PRN
  Administered 2023-06-03: 6.5 mL via INTRAVENOUS

## 2023-07-24 LAB — COMPREHENSIVE METABOLIC PANEL WITH GFR
Albumin: 4.5 (ref 3.5–5.0)
Calcium: 9 (ref 8.7–10.7)
Globulin: 2.1
eGFR: 67

## 2023-07-24 LAB — HEPATIC FUNCTION PANEL
ALT: 10 U/L (ref 7–35)
AST: 24 (ref 13–35)
Alkaline Phosphatase: 43 (ref 25–125)
Bilirubin, Total: 0.3

## 2023-07-24 LAB — CBC AND DIFFERENTIAL
HCT: 38 (ref 36–46)
Hemoglobin: 12.2 (ref 12.0–16.0)
Neutrophils Absolute: 4.6
Platelets: 224 K/uL (ref 150–400)
WBC: 6.1

## 2023-07-24 LAB — CBC: RBC: 4.2 (ref 3.87–5.11)

## 2023-07-24 LAB — BASIC METABOLIC PANEL WITH GFR
BUN: 15 (ref 4–21)
CO2: 21 (ref 13–22)
Chloride: 101 (ref 99–108)
Creatinine: 1.1 (ref 0.5–1.1)
Glucose: 91
Potassium: 4.4 meq/L (ref 3.5–5.1)
Sodium: 137 (ref 137–147)

## 2023-07-24 LAB — POCT ERYTHROCYTE SEDIMENTATION RATE, NON-AUTOMATED: Sed Rate: 3

## 2023-08-04 ENCOUNTER — Encounter: Payer: Self-pay | Admitting: Physician Assistant

## 2023-09-05 ENCOUNTER — Encounter: Payer: Self-pay | Admitting: Physician Assistant

## 2023-09-22 ENCOUNTER — Ambulatory Visit (INDEPENDENT_AMBULATORY_CARE_PROVIDER_SITE_OTHER): Payer: Self-pay | Admitting: Physician Assistant

## 2023-09-22 ENCOUNTER — Encounter: Payer: Self-pay | Admitting: Physician Assistant

## 2023-09-22 VITALS — BP 110/60 | HR 58 | Temp 98.0°F | Ht 66.0 in | Wt 129.2 lb

## 2023-09-22 DIAGNOSIS — F5101 Primary insomnia: Secondary | ICD-10-CM

## 2023-09-22 DIAGNOSIS — J309 Allergic rhinitis, unspecified: Secondary | ICD-10-CM | POA: Diagnosis not present

## 2023-09-22 DIAGNOSIS — Z1211 Encounter for screening for malignant neoplasm of colon: Secondary | ICD-10-CM | POA: Diagnosis not present

## 2023-09-22 MED ORDER — FLUTICASONE PROPIONATE 50 MCG/ACT NA SUSP: 2.0000 | Freq: Every day | NASAL | 6 refills | Status: AC

## 2023-09-22 MED ORDER — DIAZEPAM 5 MG PO TABS: 5.0000 mg | ORAL_TABLET | Freq: Every evening | ORAL | 0 refills | Status: AC | PRN

## 2023-09-22 NOTE — Progress Notes (Signed)
 Ashley Adkins is a 43 y.o. female here for a follow up of a pre-existing problem.  History of Present Illness:   Chief Complaint  Patient presents with  . Medication Refill    Pt needs refill for Diazepam  for sleep would like to discuss other options.    Discussed the use of AI scribe software for clinical note transcription with the patient, who gave verbal consent to proceed.  History of Present Illness Ashley Adkins is a 44 year old female who presents for a follow-up to refill her diazepam  prescription for anxiety and sleep issues.  She uses diazepam  a couple of times a month for nighttime anxiety and difficulty falling asleep, as well as during trips to aid sleep. Despite this, she experiences anxiety about not sleeping and sometimes feels less sharp the next day. Trazodone  was ineffective for her sleep, and gabapentin  occasionally causes grogginess. Melatonin gummies (3 mg) help her fall asleep, but she still has anxiety about not sleeping. Magnesium glycinate is ineffective, and she avoids Benadryl due to severe grogginess. Prozac  is helpful for her anxiety.  She has a persistent nighttime cough for the past week and a half, disrupting her sleep. Her daughter and son also have a cough. She takes Allegra daily for allergies, having switched from Zyrtec due to ineffectiveness. There is no daytime cough.  She experiences frequent nighttime awakenings to urinate, a pattern that started after having children. Despite taking diazepam , she still wakes up to use the bathroom.    Past Medical History:  Diagnosis Date  . Allergy 2006  . ANA positive   . Anxiety   . Arthritis   . Colon polyps 2018  . History of chicken pox   . Hyperlipidemia    never on medication  . Vaginal delivery 2011, 2014     Social History   Tobacco Use  . Smoking status: Never  . Smokeless tobacco: Never  Vaping Use  . Vaping status: Never Used  Substance Use Topics  . Alcohol use: No  . Drug use: No     Past Surgical History:  Procedure Laterality Date  . HIP SURGERY Left   . HIP SURGERY Bilateral    Labrial tear, left 2007, right 2020 done arthroscopic    Family History  Problem Relation Age of Onset  . Asthma Mother   . Other Father        Bicuspid Valve  . Heart attack Maternal Grandmother   . Heart attack Maternal Grandfather   . Breast cancer Paternal Grandmother   . Heart attack Paternal Grandfather     Allergies  Allergen Reactions  . Latex Rash    Current Medications:   Current Outpatient Medications:  .  Cholecalciferol (VITAMIN D3) 125 MCG (5000 UT) CAPS, Take 1 capsule by mouth daily., Disp: , Rfl:  .  fexofenadine (ALLEGRA) 180 MG tablet, Take 180 mg by mouth daily., Disp: , Rfl:  .  FLUoxetine  (PROZAC ) 20 MG capsule, TAKE 1 CAPSULE DAILY, Disp: 90 capsule, Rfl: 3 .  fluticasone  (FLONASE ) 50 MCG/ACT nasal spray, Place 2 sprays into both nostrils daily., Disp: 16 g, Rfl: 6 .  gabapentin  (NEURONTIN ) 100 MG capsule, Take 100 mg by mouth as needed., Disp: , Rfl:  .  ketoconazole (NIZORAL) 2 % cream, SMARTSIG:1 Application Topical 1 to 2 Times Daily, Disp: , Rfl:  .  levonorgestrel (MIRENA) 20 MCG/DAY IUD, 1 each by Intrauterine route once. Inserted on 08/21/2023 by Gyn, needs to be removed 08/2031., Disp: , Rfl:  .  MELATONIN GUMMIES PO, Take 3 mg by mouth at bedtime., Disp: , Rfl:  .  Probiotic Product (PROBIOTIC DAILY PO), Take by mouth daily. 85 BILLION, Disp: , Rfl:  .  SKYRIZI PEN 150 MG/ML pen, Inject 150 mg into the skin every 3 (three) months., Disp: , Rfl:  .  VITAMIN K PO, Take 45 mcg by mouth daily., Disp: , Rfl:  .  Zinc 50 MG TABS, 1 tablet, Disp: , Rfl:  .  diazepam  (VALIUM ) 5 MG tablet, Take 1 tablet (5 mg total) by mouth at bedtime as needed for anxiety., Disp: 20 tablet, Rfl: 0   Review of Systems:   Negative unless otherwise specified per HPI.  Vitals:   Vitals:   09/22/23 1329  BP: 110/60  Pulse: (!) 58  Temp: 98 F (36.7 C)   TempSrc: Temporal  SpO2: 97%  Weight: 129 lb 4 oz (58.6 kg)  Height: 5' 6 (1.676 m)     Body mass index is 20.86 kg/m.  Physical Exam:   Physical Exam Constitutional:      Appearance: Normal appearance. She is well-developed.  HENT:     Head: Normocephalic and atraumatic.  Eyes:     General: Lids are normal.     Extraocular Movements: Extraocular movements intact.     Conjunctiva/sclera: Conjunctivae normal.  Pulmonary:     Effort: Pulmonary effort is normal.  Musculoskeletal:        General: Normal range of motion.     Cervical back: Normal range of motion and neck supple.  Skin:    General: Skin is warm and dry.  Neurological:     Mental Status: She is alert and oriented to person, place, and time.  Psychiatric:        Attention and Perception: Attention and perception normal.        Mood and Affect: Mood normal.        Behavior: Behavior normal.        Thought Content: Thought content normal.        Judgment: Judgment normal.     Assessment and Plan:   Assessment and Plan Assessment & Plan Primary insomnia  Chronic insomnia with anxiety, worsened by nighttime panic. Trazodone  and gabapentin  ineffective or caused grogginess. Melatonin provides some benefit. Diazepam  effective but limited use due to dependency concerns. Exploring alternatives for sleep without strong sedation. - Refilled diazepam  prescription. - Provided list of alternative sleep medications including Belsomra  and Lunesta. - Encouraged trial of gabapentin  30 minutes to an hour before bedtime. - Advised against doxepin due to interaction with Prozac . - Continued Prozac  for anxiety. - Sent MyChart message with medication options.  Allergic rhinitis  Chronic allergic rhinitis with postnasal drip causing nighttime cough. Allegra provides some relief. Flonase  was beneficial. - Refilled Flonase  nasal spray prescription. - Continued Allegra daily. - Consider allergy testing if symptoms  persist.  General Health Maintenance Approaching age for routine colon cancer screening. - Advised mother's doctor to request a calcium score CT scan. - Scheduled colonoscopy at age 38, referral to [facility] facility.     Lucie Buttner, PA-C

## 2023-09-24 ENCOUNTER — Other Ambulatory Visit: Payer: Self-pay | Admitting: Physician Assistant

## 2023-09-24 ENCOUNTER — Other Ambulatory Visit: Payer: Self-pay | Admitting: Medical Genetics

## 2023-09-24 MED ORDER — BELSOMRA 5 MG PO TABS: 5.0000 mg | ORAL_TABLET | Freq: Every evening | ORAL | 0 refills | Status: AC | PRN

## 2023-10-16 ENCOUNTER — Other Ambulatory Visit

## 2023-11-27 ENCOUNTER — Other Ambulatory Visit

## 2023-11-27 DIAGNOSIS — Z006 Encounter for examination for normal comparison and control in clinical research program: Secondary | ICD-10-CM

## 2023-12-08 LAB — GENECONNECT MOLECULAR SCREEN: Genetic Analysis Overall Interpretation: NEGATIVE
